# Patient Record
Sex: Female | Born: 1940 | Race: White | Hispanic: No | Marital: Married | Smoking: Never smoker
Health system: Southern US, Community
[De-identification: ages and names within clinical notes are randomized; demographics above are authoritative.]

## PROBLEM LIST (undated history)

## (undated) DIAGNOSIS — Z950 Presence of cardiac pacemaker: Secondary | ICD-10-CM

## (undated) DIAGNOSIS — I480 Paroxysmal atrial fibrillation: Secondary | ICD-10-CM

## (undated) DIAGNOSIS — I10 Essential (primary) hypertension: Secondary | ICD-10-CM

## (undated) DIAGNOSIS — M1990 Unspecified osteoarthritis, unspecified site: Secondary | ICD-10-CM

## (undated) DIAGNOSIS — R053 Chronic cough: Secondary | ICD-10-CM

## (undated) DIAGNOSIS — K219 Gastro-esophageal reflux disease without esophagitis: Secondary | ICD-10-CM

## (undated) DIAGNOSIS — K5900 Constipation, unspecified: Secondary | ICD-10-CM

## (undated) HISTORY — PX: CHOLECYSTECTOMY: SHX55

## (undated) HISTORY — PX: CATARACT EXTRACTION: SUR2

## (undated) HISTORY — PX: CARPAL TUNNEL RELEASE: SHX101

## (undated) HISTORY — PX: BREAST LUMPECTOMY: SHX2

## (undated) HISTORY — PX: FEMUR FRACTURE SURGERY: SHX633

## (undated) HISTORY — PX: RECONSTRUCTION OF EYELID: SHX6576

## (undated) HISTORY — PX: PACEMAKER IMPLANT: EP1218

## (undated) HISTORY — PX: BREAST BIOPSY: SHX20

## (undated) HISTORY — PX: ABDOMINAL HYSTERECTOMY: SHX81

---

## 2013-03-03 DIAGNOSIS — M1990 Unspecified osteoarthritis, unspecified site: Secondary | ICD-10-CM | POA: Insufficient documentation

## 2013-03-03 DIAGNOSIS — M4802 Spinal stenosis, cervical region: Secondary | ICD-10-CM | POA: Insufficient documentation

## 2013-03-31 DIAGNOSIS — K219 Gastro-esophageal reflux disease without esophagitis: Secondary | ICD-10-CM | POA: Diagnosis present

## 2013-06-08 DIAGNOSIS — E785 Hyperlipidemia, unspecified: Secondary | ICD-10-CM | POA: Diagnosis present

## 2013-09-14 DIAGNOSIS — G43909 Migraine, unspecified, not intractable, without status migrainosus: Secondary | ICD-10-CM | POA: Insufficient documentation

## 2013-09-14 DIAGNOSIS — M50222 Other cervical disc displacement at C5-C6 level: Secondary | ICD-10-CM | POA: Insufficient documentation

## 2013-09-14 DIAGNOSIS — I10 Essential (primary) hypertension: Secondary | ICD-10-CM | POA: Diagnosis present

## 2013-10-18 ENCOUNTER — Emergency Department (HOSPITAL_BASED_OUTPATIENT_CLINIC_OR_DEPARTMENT_OTHER)
Admission: EM | Admit: 2013-10-18 | Discharge: 2013-10-18 | Disposition: A | Discharge status: Home / Self Care | Payer: Medicare Other | Attending: Emergency Medicine | Admitting: Emergency Medicine

## 2013-10-18 DIAGNOSIS — Y92009 Unspecified place in unspecified non-institutional (private) residence as the place of occurrence of the external cause: Secondary | ICD-10-CM | POA: Insufficient documentation

## 2013-10-18 DIAGNOSIS — W540XXA Bitten by dog, initial encounter: Secondary | ICD-10-CM | POA: Insufficient documentation

## 2013-10-18 DIAGNOSIS — Y9301 Activity, walking, marching and hiking: Secondary | ICD-10-CM | POA: Diagnosis not present

## 2013-10-18 DIAGNOSIS — Z23 Encounter for immunization: Secondary | ICD-10-CM | POA: Diagnosis not present

## 2013-10-18 DIAGNOSIS — IMO0002 Reserved for concepts with insufficient information to code with codable children: Secondary | ICD-10-CM | POA: Diagnosis present

## 2013-10-18 MED ORDER — RABIES VACCINE, PCEC IM SUSR
1.0000 mL | Freq: Once | INTRAMUSCULAR | Status: AC
  Administered 2013-10-18: 1 mL via INTRAMUSCULAR
  Filled 2013-10-18: qty 1

## 2013-10-18 MED ORDER — RABIES IMMUNE GLOBULIN 150 UNIT/ML IM INJ
20.0000 [IU]/kg | INJECTION | Freq: Once | INTRAMUSCULAR | Status: AC
Start: 2013-10-18 — End: 2013-10-18
  Administered 2013-10-18: 1125 [IU]
  Filled 2013-10-18: qty 8

## 2013-10-18 MED ORDER — TETANUS-DIPHTH-ACELL PERTUSSIS 5-2.5-18.5 LF-MCG/0.5 IM SUSP
0.5000 mL | Freq: Once | INTRAMUSCULAR | Status: AC
  Administered 2013-10-18: 0.5 mL via INTRAMUSCULAR
  Filled 2013-10-18: qty 0.5

## 2013-10-18 NOTE — ED Provider Notes (Signed)
CSN: [id number]     Arrival date & time 10/18/13  1031 History   First MD Initiated Contact with Patient 10/18/13 1050     Chief Complaint  Patient presents with  . Insect Bite     (Consider location/radiation/quality/duration/timing/severity/associated sxs/prior Treatment) HPI Comments: Patient presents with a dog bite. She was out walking this morning and a neighbors dog ran through an electric  jumped up and either bit or scratched her right arm. She has a small wound to her right forearm. She denies any other injuries. She's not sure when her last tetanus shot was. Animal control does have the dog in quarantine currently. The dog currently has not exhibited any unusual behaviors however the dog is 5 months behind on it's rabies vaccines   No past medical history on file. No past surgical history on file. No family history on file. History  Substance Use Topics  . Smoking status: Not on file  . Smokeless tobacco: Not on file  . Alcohol Use: Not on file   OB History   No data available     Review of Systems  Constitutional: Negative for fever.  Gastrointestinal: Negative for nausea and vomiting.  Musculoskeletal: Negative for arthralgias, back pain, joint swelling and neck pain.  Skin: Positive for wound.  Neurological: Negative for weakness, numbness and headaches.      Allergies  Review of patient's allergies indicates not on file.  Home Medications   Prior to Admission medications   Not on File   BP 143/60  Pulse 65  Temp(Src) 97.9 F (36.6 C) (Oral)  Resp 18  Ht  (1.499 m)  Wt 125 lb 14.4 oz (57.108 kg)  BMI 25.42 kg/m2  SpO2 100% Physical Exam  Constitutional: She is oriented to person, place, and time. She appears well-developed and well-nourished.  HENT:  Head: Normocephalic and atraumatic.  Neck: Normal range of motion. Neck supple.  Cardiovascular: Normal rate.   Pulmonary/Chest: Effort normal.  Musculoskeletal: She exhibits no edema and no  tenderness.  Small abrasion to the mid dorsal aspect of the right forearm. There's no active bleeding. There is no underlying bony tenderness.  Neurological: She is alert and oriented to person, place, and time.  Skin: Skin is warm and dry.  Psychiatric: She has a normal mood and affect.    ED Course  Procedures (including critical care time) Labs Review Labs Reviewed - No data to display  Imaging Review No results found.   EKG Interpretation None      MDM   Final diagnoses:  Dog bite    Patient is opted to go ahead and start the rabies series. Her tetanus shot was updated. Her wound was irrigated and dressed. Rabies immunoglobulin was infiltrated into the wound and given IM. She was started on the rabies vaccines and instructed to return to an urgent care center to receive the remaining 3 vaccines.    Rolan Bucco, MD 10/18/13 1200

## 2013-10-18 NOTE — ED Notes (Signed)
States she has a bite or scratch to right arm. Pt has red area to right out arm with scratch to mid area. Pt states she was out on walk and dog ran up to her. Unsure if dog bit or scratched her. Dog was 5 months out on rabies shots.

## 2013-10-18 NOTE — Discharge Instructions (Signed)

## 2013-10-21 ENCOUNTER — Encounter: Payer: Self-pay | Admitting: Emergency Medicine

## 2013-10-21 ENCOUNTER — Emergency Department (INDEPENDENT_AMBULATORY_CARE_PROVIDER_SITE_OTHER)
Admission: EM | Admit: 2013-10-21 | Discharge: 2013-10-21 | Disposition: A | Discharge status: Home / Self Care | Payer: Medicare Other | Source: Home / Self Care

## 2013-10-21 DIAGNOSIS — Z203 Contact with and (suspected) exposure to rabies: Secondary | ICD-10-CM

## 2013-10-21 DIAGNOSIS — Z23 Encounter for immunization: Secondary | ICD-10-CM

## 2013-10-21 DIAGNOSIS — S51809A Unspecified open wound of unspecified forearm, initial encounter: Secondary | ICD-10-CM

## 2013-10-21 HISTORY — DX: Essential (primary) hypertension: I10

## 2013-10-21 MED ORDER — RABIES VACCINE, PCEC IM SUSR
1.0000 mL | Freq: Once | INTRAMUSCULAR | Status: AC
  Administered 2013-10-21: 1 mL via INTRAMUSCULAR

## 2013-10-21 NOTE — ED Notes (Signed)
Here for 2nd shot in Rabies series

## 2013-10-21 NOTE — ED Notes (Signed)
Elizabeth Barnett is here for 2nd Rabavert vaccine, initial given @ [facility] in addition to immunoglobulin. She reports here that she received the initial vaccine without adverse reaction Vaccine administered @ 945. I filled in list of medications and allergies after she left and received an alert/contraindication for Voltaren allergy and the Rabavert vaccine. Her allergies were not documented in Epic from previous ER visit. I called patient and left message to call back to discuss allergy. She showed up @ [facility] about 30 minutes later (11:10am) and reports anaphylaxis with Voltaren. She denies any SOB or throat tightness/swelling and denies any adverse reaction from initial Rabavert vaccine given @ [facility].Vitals BP141/83, HR 76, temp 98.43F and oxygen 97%. She reports "I feel fine".

## 2013-10-25 ENCOUNTER — Encounter: Payer: Self-pay | Admitting: Emergency Medicine

## 2013-10-25 ENCOUNTER — Emergency Department (INDEPENDENT_AMBULATORY_CARE_PROVIDER_SITE_OTHER)
Admission: EM | Admit: 2013-10-25 | Discharge: 2013-10-25 | Disposition: A | Discharge status: Home / Self Care | Payer: Medicare Other | Source: Home / Self Care

## 2013-10-25 DIAGNOSIS — Z23 Encounter for immunization: Secondary | ICD-10-CM

## 2013-10-25 DIAGNOSIS — S51809A Unspecified open wound of unspecified forearm, initial encounter: Secondary | ICD-10-CM

## 2013-10-25 MED ORDER — RABIES VACCINE, PCEC IM SUSR
1.0000 mL | Freq: Once | INTRAMUSCULAR | Status: AC
  Administered 2013-10-25: 1 mL via INTRAMUSCULAR

## 2013-10-25 NOTE — ED Notes (Signed)
Here to receive Third Rabies Injection to right arm.

## 2015-05-09 DIAGNOSIS — E559 Vitamin D deficiency, unspecified: Secondary | ICD-10-CM | POA: Insufficient documentation

## 2016-02-28 DIAGNOSIS — H532 Diplopia: Secondary | ICD-10-CM | POA: Insufficient documentation

## 2017-08-04 DIAGNOSIS — G5622 Lesion of ulnar nerve, left upper limb: Secondary | ICD-10-CM | POA: Insufficient documentation

## 2017-08-04 DIAGNOSIS — G44209 Tension-type headache, unspecified, not intractable: Secondary | ICD-10-CM | POA: Insufficient documentation

## 2017-09-11 ENCOUNTER — Encounter (HOSPITAL_COMMUNITY)
Admission: RE | Disposition: A | Discharge status: Home / Self Care | Payer: Self-pay | Source: Ambulatory Visit | Attending: Gastroenterology

## 2017-09-11 ENCOUNTER — Ambulatory Visit (HOSPITAL_COMMUNITY)
Admission: RE | Admit: 2017-09-11 | Discharge: 2017-09-11 | Disposition: A | Discharge status: Home / Self Care | Payer: Medicare Other | Source: Ambulatory Visit | Attending: Gastroenterology | Admitting: Gastroenterology

## 2017-09-11 DIAGNOSIS — K6289 Other specified diseases of anus and rectum: Secondary | ICD-10-CM | POA: Insufficient documentation

## 2017-09-11 DIAGNOSIS — K5900 Constipation, unspecified: Secondary | ICD-10-CM | POA: Insufficient documentation

## 2017-09-11 HISTORY — PX: ANAL RECTAL MANOMETRY: SHX6358

## 2017-09-11 SURGERY — MANOMETRY, ANORECTAL

## 2017-09-11 NOTE — Progress Notes (Signed)
Ano rectal manometry performed per protocol.  Patient tolerated procedure without complications.  Patient unable to expel balloon within the three minute time frame during the balloon expulsion test.  Results will be sent to Dr. Maisie Fushomas to interpret.

## 2017-09-14 ENCOUNTER — Encounter (HOSPITAL_COMMUNITY): Payer: Self-pay | Admitting: Gastroenterology

## 2018-03-31 DIAGNOSIS — I4510 Unspecified right bundle-branch block: Secondary | ICD-10-CM | POA: Insufficient documentation

## 2018-08-30 DIAGNOSIS — I495 Sick sinus syndrome: Secondary | ICD-10-CM | POA: Insufficient documentation

## 2018-09-03 DIAGNOSIS — Z950 Presence of cardiac pacemaker: Secondary | ICD-10-CM | POA: Insufficient documentation

## 2018-09-28 DIAGNOSIS — R7303 Prediabetes: Secondary | ICD-10-CM | POA: Insufficient documentation

## 2018-11-12 DIAGNOSIS — I480 Paroxysmal atrial fibrillation: Secondary | ICD-10-CM | POA: Insufficient documentation

## 2019-04-14 DIAGNOSIS — K649 Unspecified hemorrhoids: Secondary | ICD-10-CM | POA: Insufficient documentation

## 2019-04-14 DIAGNOSIS — Z8719 Personal history of other diseases of the digestive system: Secondary | ICD-10-CM | POA: Insufficient documentation

## 2019-08-01 DIAGNOSIS — M6289 Other specified disorders of muscle: Secondary | ICD-10-CM | POA: Insufficient documentation

## 2019-12-06 ENCOUNTER — Other Ambulatory Visit: Payer: Self-pay | Admitting: Gastroenterology

## 2019-12-06 DIAGNOSIS — R110 Nausea: Secondary | ICD-10-CM

## 2019-12-06 DIAGNOSIS — K219 Gastro-esophageal reflux disease without esophagitis: Secondary | ICD-10-CM

## 2019-12-14 ENCOUNTER — Ambulatory Visit
Admission: RE | Admit: 2019-12-14 | Discharge: 2019-12-14 | Disposition: A | Discharge status: Home / Self Care | Payer: Medicare Other | Source: Ambulatory Visit | Attending: Gastroenterology | Admitting: Gastroenterology

## 2019-12-14 DIAGNOSIS — K219 Gastro-esophageal reflux disease without esophagitis: Secondary | ICD-10-CM

## 2019-12-14 DIAGNOSIS — R110 Nausea: Secondary | ICD-10-CM

## 2019-12-14 IMAGING — RF DG UGI W/ HIGH DENSITY W/O KUB
10 series · 14 of 24 positions shown · non-contrast
Comparison: CT abdomen [DATE]

CLINICAL DATA: Abdominal pain.  GE reflux.

EXAM:
UPPER GI SERIES WITH KUB
TECHNIQUE: After obtaining a scout radiograph a routine upper GI series was
performed using thin and high density barium.
FLUOROSCOPY TIME:  Fluoroscopy Time:  2 minutes and 36 seconds
Radiation Exposure Index (if provided by the fluoroscopic device):
160 mGy
Number of Acquired Spot Images: 3

[Series 1: one shot · 0.14mm/px · 1 of 1 slices shown (1 of 4)]
[im 1/1]
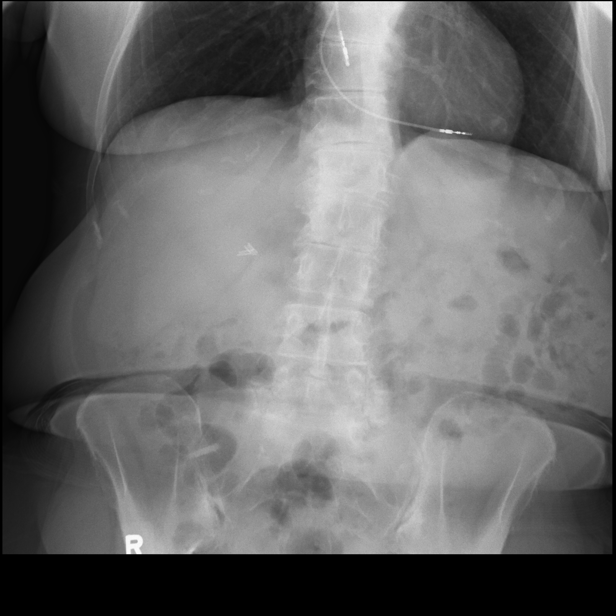

[Series 2: sequence · 1 of 9 frames shown (1 of 6)]
[frame 5/9]
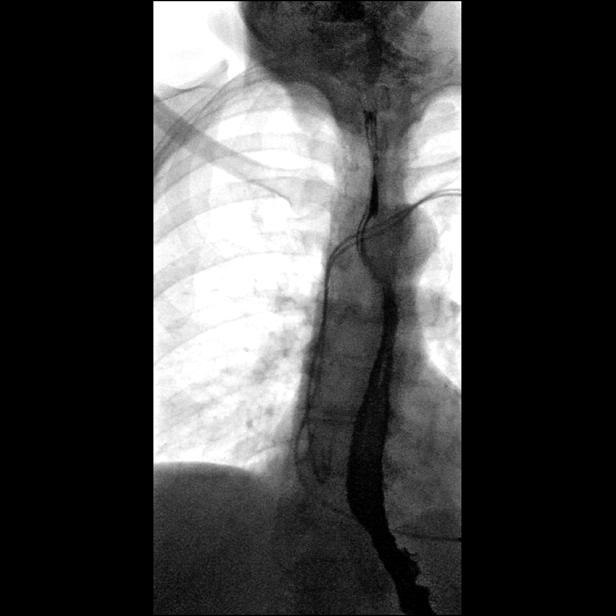

[Series 3: one shot · 0.15mm/px · 1 of 2 slices shown (2 of 4)]
[im 2/2]
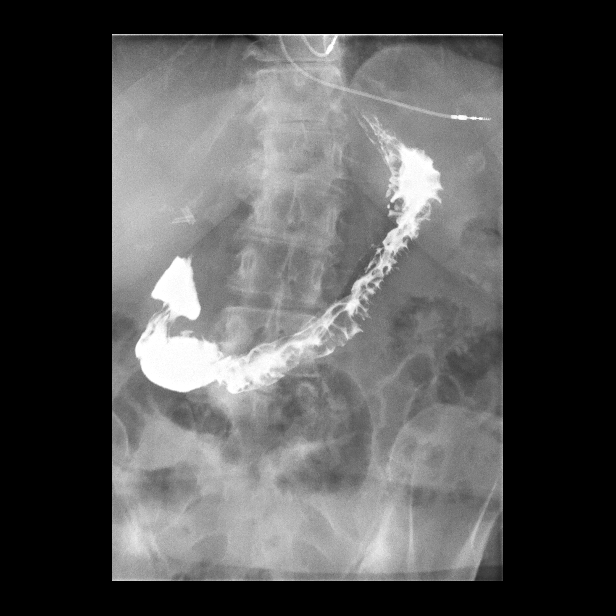

[Series 4: sequence · 2 of 12 frames shown (2 of 6)]
[frame 7/12]
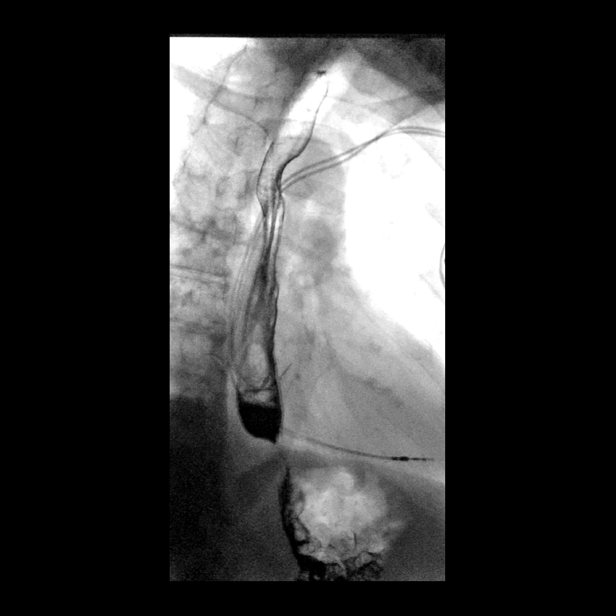
[frame 11/12]
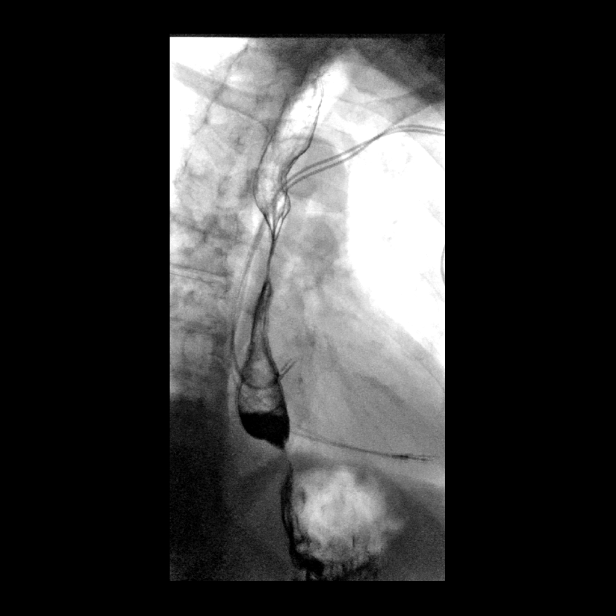

[Series 5: sequence · 1 of 14 frames shown (3 of 6)]
[frame 8/14]
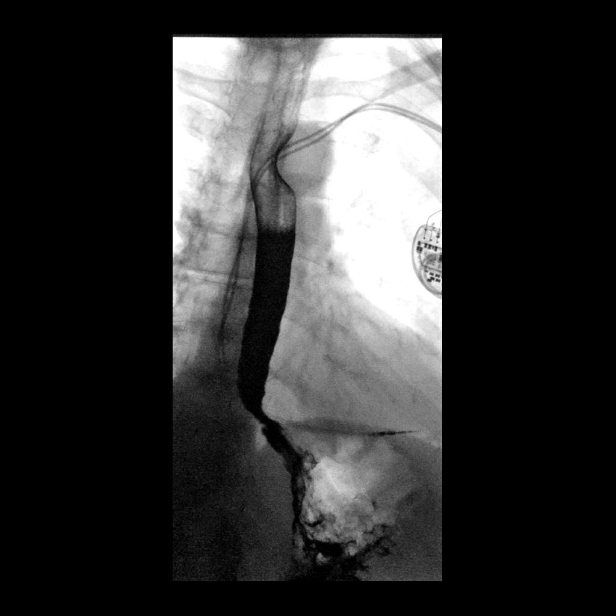

[Series 6: one shot · 0.15mm/px · 2 of 5 slices shown (3 of 4)]
[im 2/5]
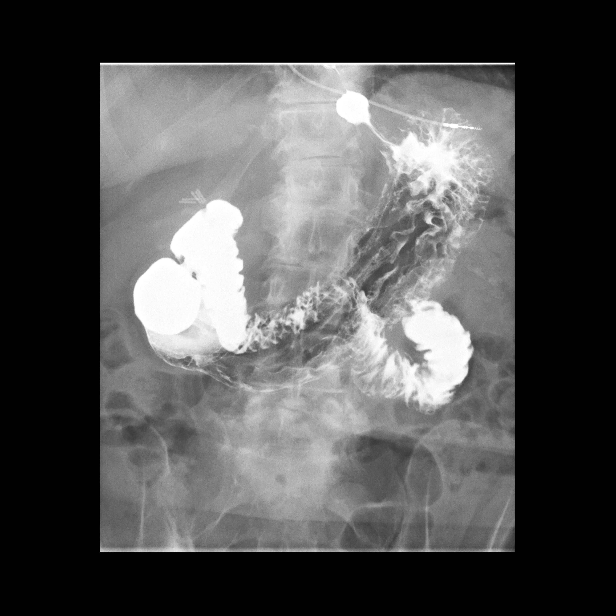
[im 3/5]
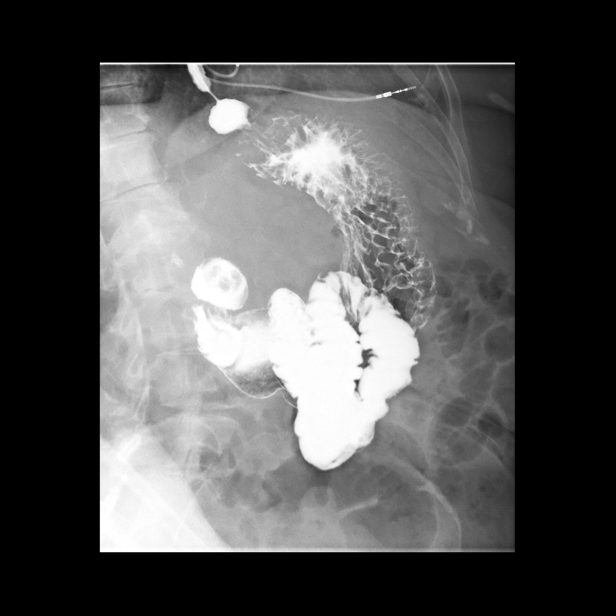

[Series 7: sequence · 2 of 17 frames shown (4 of 6)]
[frame 3/17]
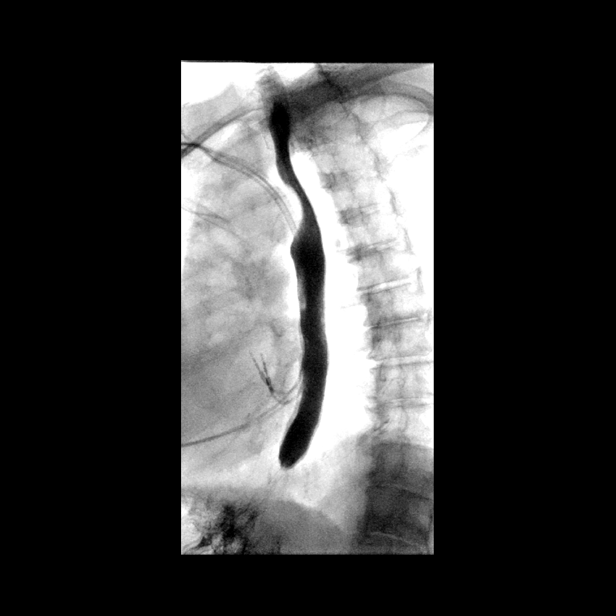
[frame 15/17]
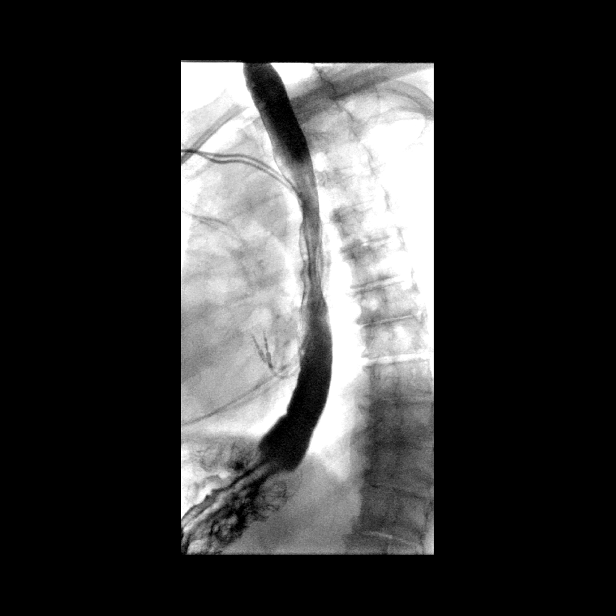

[Series 8: sequence · 2 of 21 frames shown (5 of 6)]
[frame 14/21]
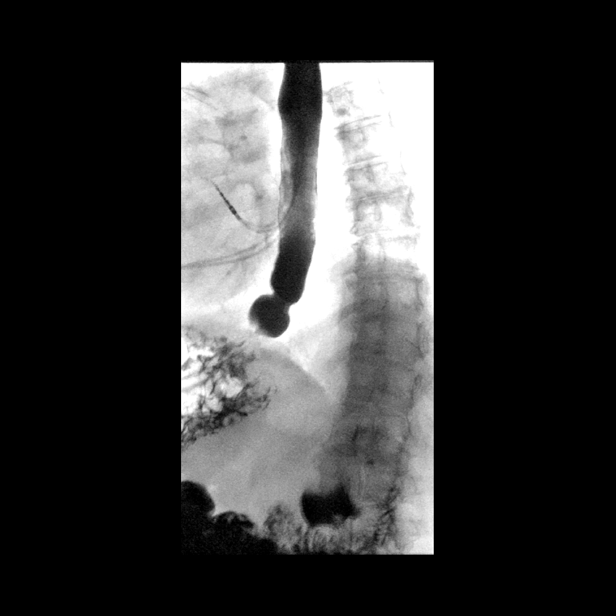
[frame 18/21]
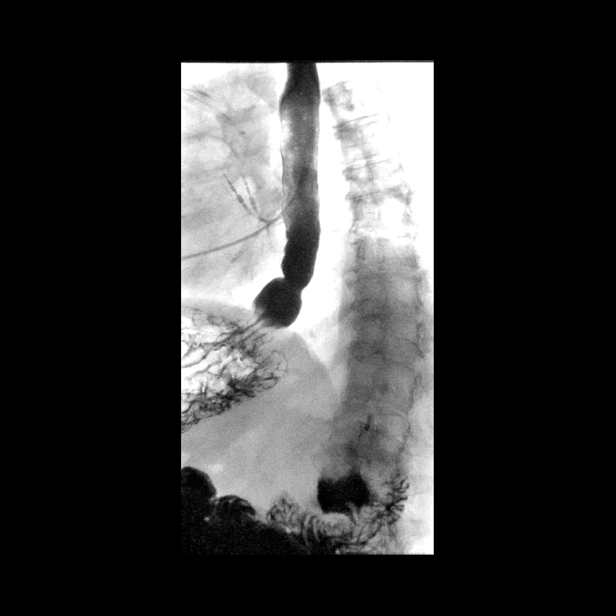

[Series 9: sequence · 1 of 7 frames shown (6 of 6)]
[frame 4/7]
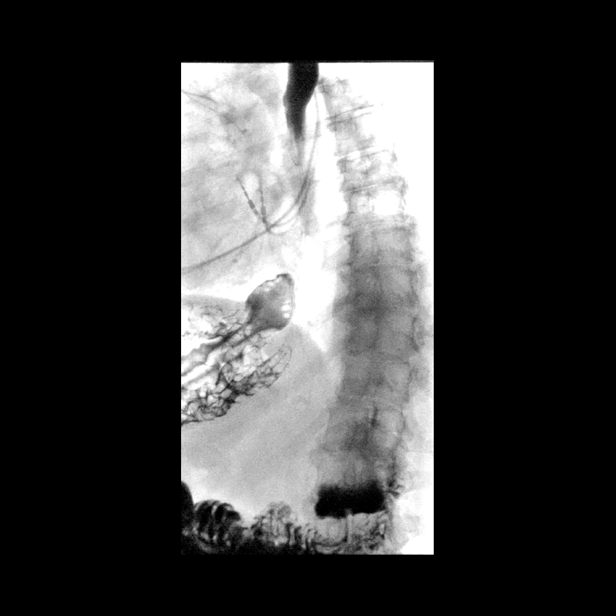

[Series 10: one shot · 1 of 2 slices shown (4 of 4)]
[im 2/2]
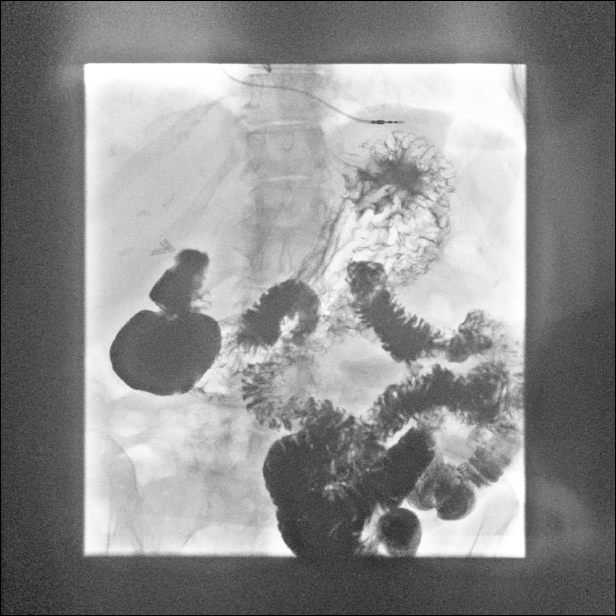

[14 of 24 positions shown; findings below may reference images not displayed]

FINDINGS: Nonspecific esophageal motility disorder with occasional disruption
of the primary peristaltic wave and occasional tertiary
contractions. There is a small sliding-type hiatal hernia and a
lower esophageal mucosal B ring. The 13 mm barium pill did pass
through this area.

Normal mucosal folds in the stomach. No evidence of inflammation or
ulcer. The duodenal bulb and C-loop are unremarkable. The duodenal
jejunal junction is in its normal anatomic location.

No demonstrable GE reflux.
IMPRESSION: 1. Nonspecific esophageal motility disorder.
2. Small sliding-type hiatal hernia and lower esophageal mucosal B
ring. The 13 mm barium pill did pass through this area.
3. No demonstrable GE reflux.
4. Normal appearance of the stomach and duodenum.

## 2020-01-31 DIAGNOSIS — D84821 Immunodeficiency due to drugs: Secondary | ICD-10-CM | POA: Insufficient documentation

## 2020-02-27 ENCOUNTER — Telehealth: Payer: Self-pay

## 2020-02-27 ENCOUNTER — Other Ambulatory Visit: Payer: Self-pay | Admitting: Physician Assistant

## 2020-02-27 ENCOUNTER — Other Ambulatory Visit: Payer: Self-pay

## 2020-02-27 DIAGNOSIS — R1084 Generalized abdominal pain: Secondary | ICD-10-CM

## 2020-02-27 MED ORDER — PREDNISONE 50 MG PO TABS: ORAL_TABLET | ORAL | 0 refills | Status: DC

## 2020-02-27 NOTE — Telephone Encounter (Signed)
13 hr prep for contrast allergy

## 2020-03-01 ENCOUNTER — Ambulatory Visit
Admission: RE | Admit: 2020-03-01 | Discharge: 2020-03-01 | Disposition: A | Discharge status: Home / Self Care | Payer: Medicare Other | Source: Ambulatory Visit | Attending: Physician Assistant | Admitting: Physician Assistant

## 2020-03-01 DIAGNOSIS — R1084 Generalized abdominal pain: Secondary | ICD-10-CM

## 2020-03-01 IMAGING — CT CT ABD-PELV W/ CM
2 of 5 series · 12 of 46 positions shown, 14 images · IV contrast (iopamidol)
Comparison: Noncontrast CT on [DATE]

CLINICAL DATA: Right lower quadrant pain for 3 months. Possible
hernia.

EXAM:
CT ABDOMEN AND PELVIS WITH CONTRAST
TECHNIQUE: Multidetector CT imaging of the abdomen and pelvis was performed
using the standard protocol following bolus administration of
intravenous contrast.
CONTRAST:  100mL [CU] IOPAMIDOL ([CU]) INJECTION 61%

[Series 2: abd pelvis 5.00 br40 s3 axial · axial · 0.53mm/px · z∈[+1275,+1580]mm · 9 of 73 slices shown, 11 images]
[im 8/73  soft-tissue]
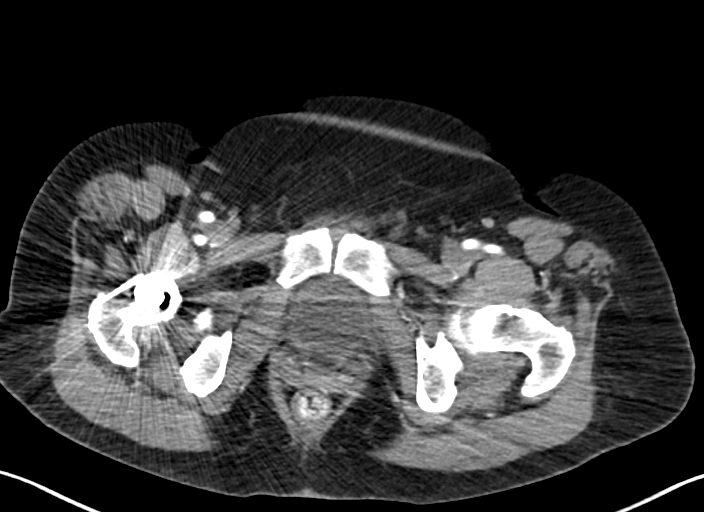
[im 8/73  bone]
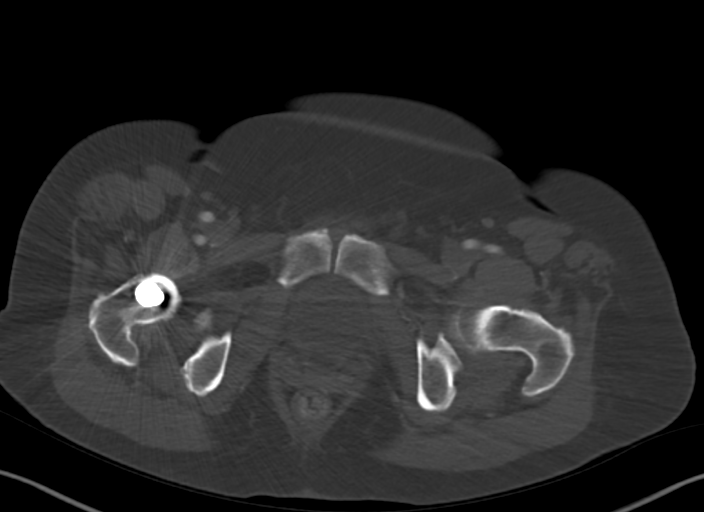
[im 16/73  soft-tissue]
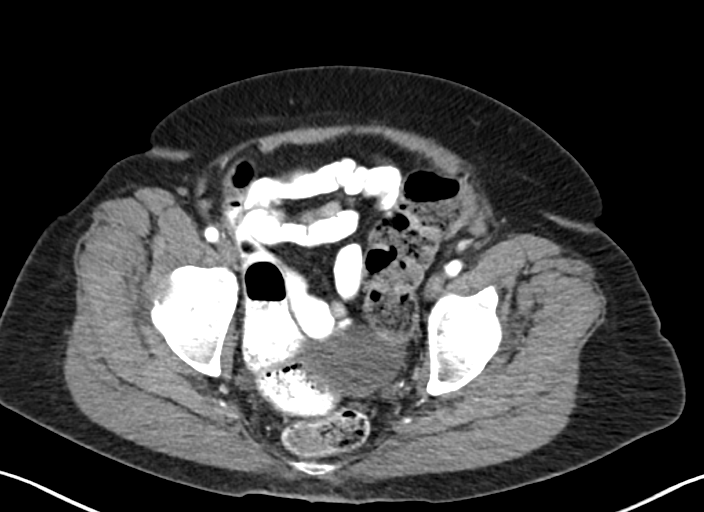
[im 23/73  soft-tissue]
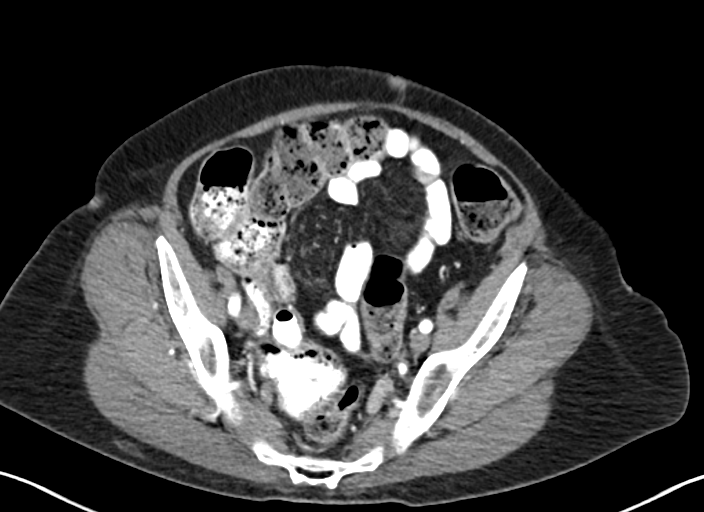
[im 31/73  soft-tissue]
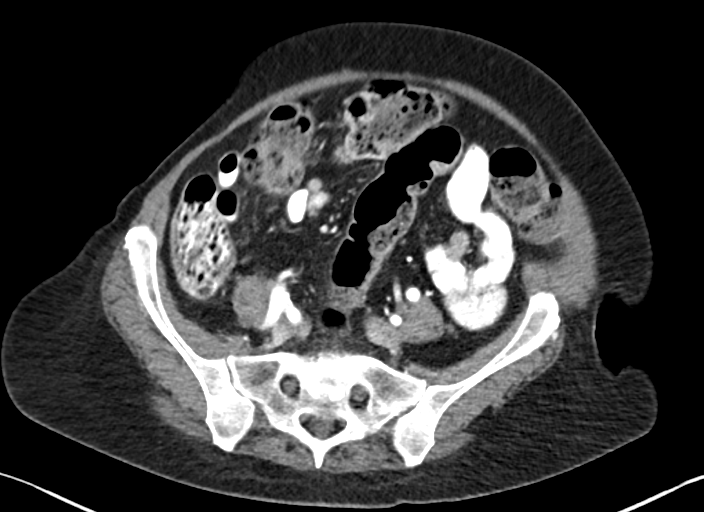
[im 38/73  soft-tissue]
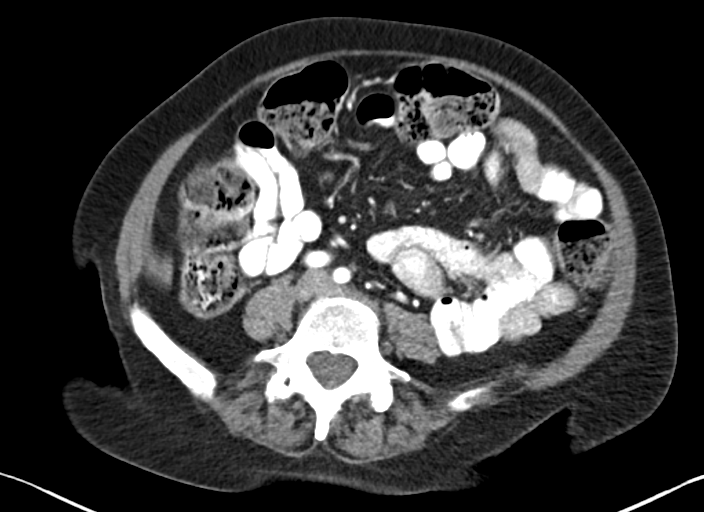
[im 46/73  soft-tissue]
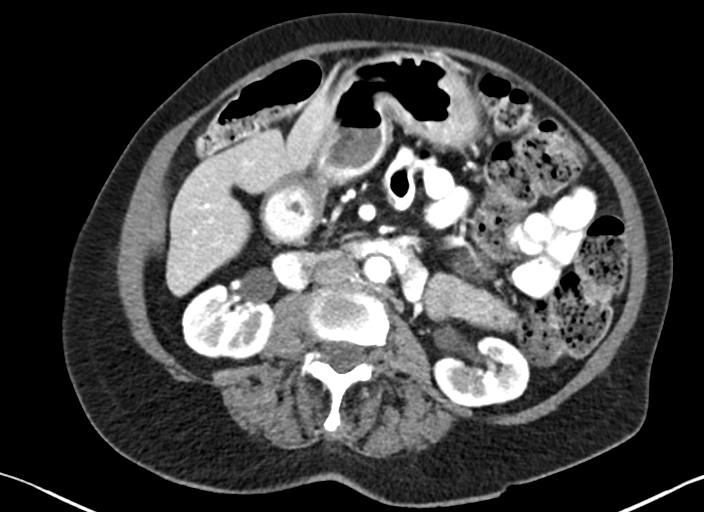
[im 54/73  soft-tissue]
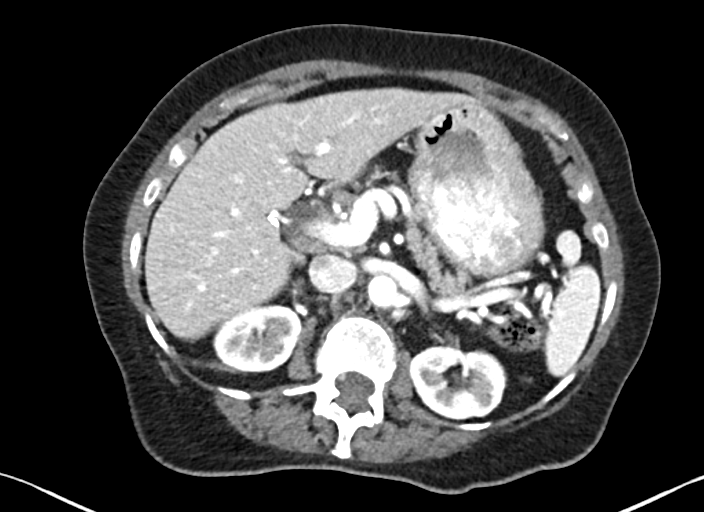
[im 61/73  soft-tissue]
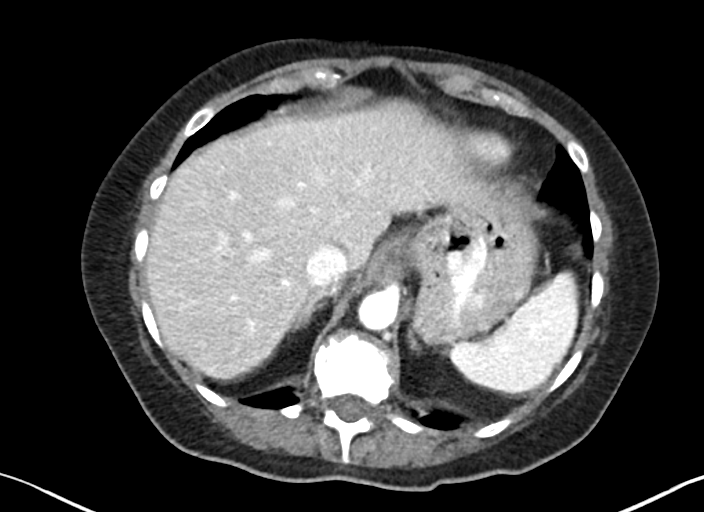
[im 69/73  soft-tissue]
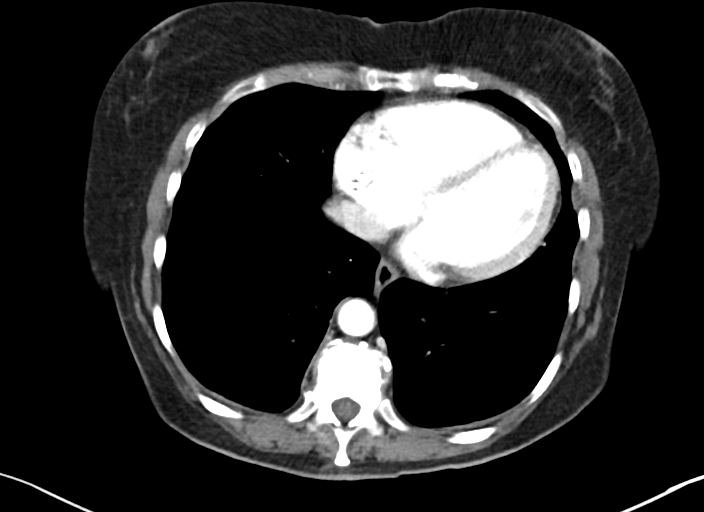
[im 69/73  bone]
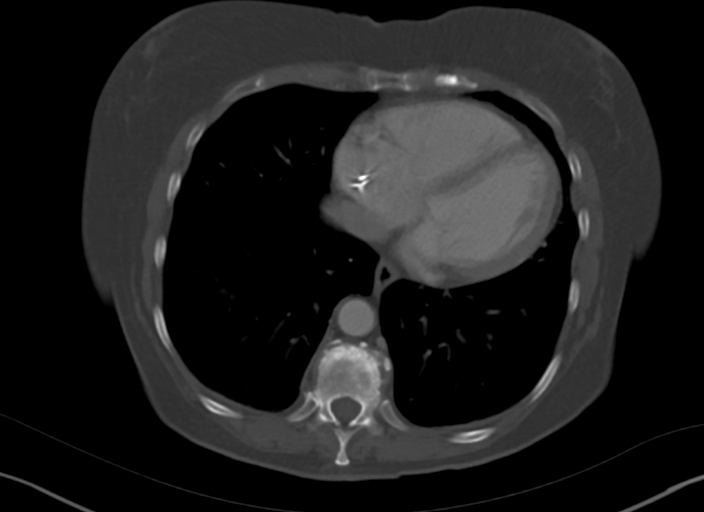

[Series 6: abd pelvis 2.00 br40 s3 cor · coronal · 0.72mm/px · 3 of 135 slices shown]
[im 45/135  soft-tissue]
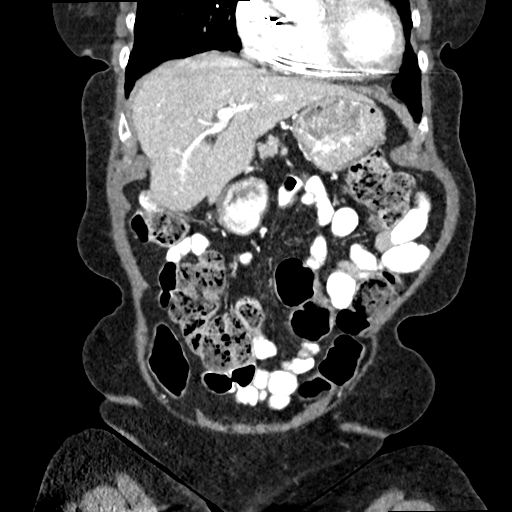
[im 60/135  soft-tissue]
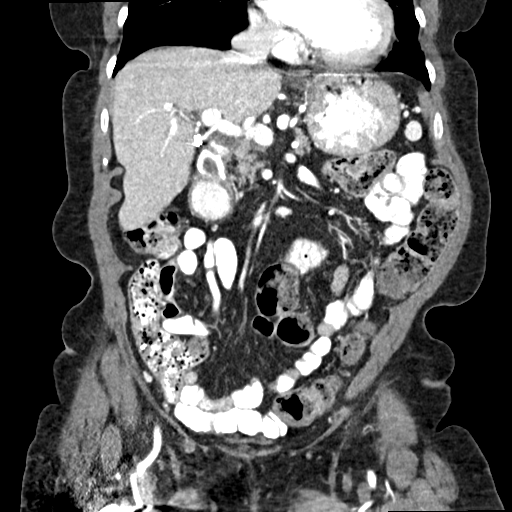
[im 75/135  soft-tissue]
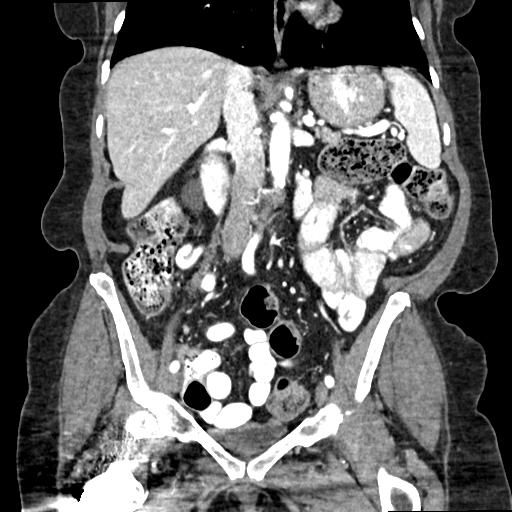

[12 of 46 positions shown; findings below may reference images not displayed]

FINDINGS: Lower Chest: No acute findings.

Hepatobiliary: No hepatic masses identified. Prior cholecystectomy.
No evidence of biliary obstruction.

Pancreas:  No mass or inflammatory changes.

Spleen: Within normal limits in size. Tiny cyst noted in inferior
aspect of spleen.

Adrenals/Urinary Tract: No masses identified. No evidence of
ureteral calculi or hydronephrosis.

Stomach/Bowel: No evidence of obstruction, inflammatory process or
abnormal fluid collections. Although the appendix is not directly
visualized, no inflammatory process seen in region of the cecum or
elsewhere. Large amount of stool seen throughout the colon which is
increased since previous study.

Vascular/Lymphatic: No pathologically enlarged lymph nodes. No
abdominal aortic aneurysm. Aortic atherosclerotic calcification
noted.

Reproductive: Prior hysterectomy noted. Adnexal regions are
unremarkable in appearance.

Other:  None.  No evidence of ventral abdominal wall hernia or mass.

Musculoskeletal: No suspicious bone lesions identified. Internal
fixation hardware again seen in the right hip.
IMPRESSION: No evidence of appendicitis or other acute findings.

Large stool burden noted; recommend clinical correlation for
possible constipation.

Aortic Atherosclerosis ([CU]-[CU]).

## 2020-03-01 MED ORDER — IOPAMIDOL (ISOVUE-300) INJECTION 61%
100.0000 mL | Freq: Once | INTRAVENOUS | Status: AC | PRN
  Administered 2020-03-01: 100 mL via INTRAVENOUS

## 2020-03-08 DIAGNOSIS — F411 Generalized anxiety disorder: Secondary | ICD-10-CM | POA: Insufficient documentation

## 2020-08-30 ENCOUNTER — Other Ambulatory Visit (HOSPITAL_COMMUNITY): Payer: Self-pay | Admitting: Orthopaedic Surgery

## 2020-08-30 DIAGNOSIS — M545 Low back pain, unspecified: Secondary | ICD-10-CM

## 2020-09-27 ENCOUNTER — Other Ambulatory Visit: Payer: Self-pay

## 2020-09-27 ENCOUNTER — Ambulatory Visit (HOSPITAL_COMMUNITY)
Admission: RE | Admit: 2020-09-27 | Discharge: 2020-09-27 | Disposition: A | Discharge status: Home / Self Care | Payer: Medicare Other | Source: Ambulatory Visit | Attending: Orthopaedic Surgery | Admitting: Orthopaedic Surgery

## 2020-09-27 ENCOUNTER — Ambulatory Visit (HOSPITAL_COMMUNITY)
Admission: RE | Admit: 2020-09-27 | Discharge: 2020-09-27 | Disposition: A | Discharge status: Home / Self Care | Payer: Medicare Other | Source: Ambulatory Visit | Attending: Physician Assistant | Admitting: Physician Assistant

## 2020-09-27 DIAGNOSIS — M545 Low back pain, unspecified: Secondary | ICD-10-CM | POA: Diagnosis present

## 2020-09-27 DIAGNOSIS — Z950 Presence of cardiac pacemaker: Secondary | ICD-10-CM | POA: Insufficient documentation

## 2020-09-27 IMAGING — MR MR LUMBAR SPINE W/O CM
4 of 5 series · 28 of 48 positions shown · non-contrast
Comparison: None.

CLINICAL DATA: Low back pain, unspecified back pain laterality,
unspecified chronicity, unspecified whether sciatica present [BR]
([BR]-CM)

EXAM:
MRI LUMBAR SPINE WITHOUT CONTRAST
TECHNIQUE: Multiplanar, multisequence MR imaging of the lumbar spine was
performed. No intravenous contrast was administered.

[Series 5: T2 · sagittal · 4.0mm · 0.73mm/px · 8 of 16 slices shown (1 of 2)]
[im 1/16]
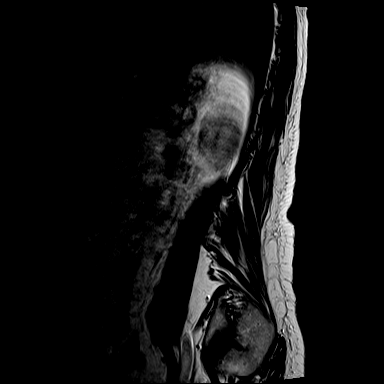
[im 3/16]
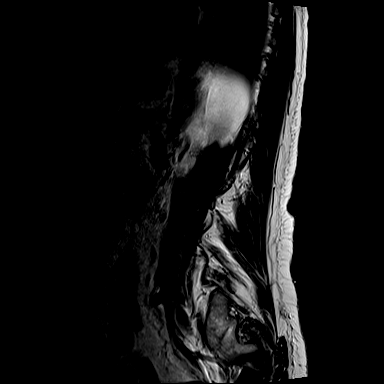
[im 5/16]
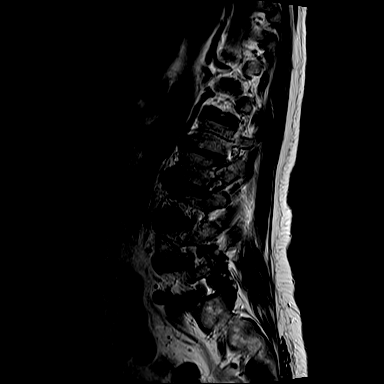
[im 7/16]
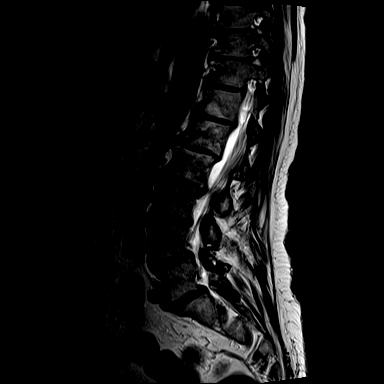
[im 9/16]
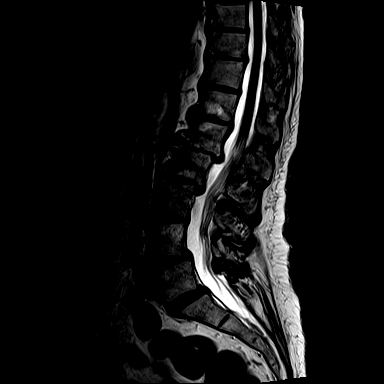
[im 11/16]
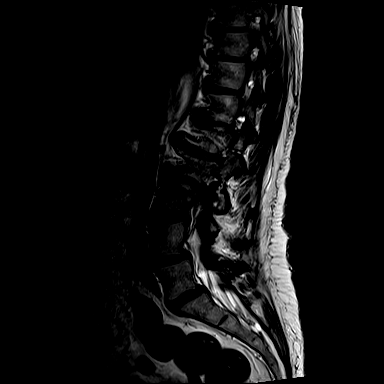
[im 13/16]
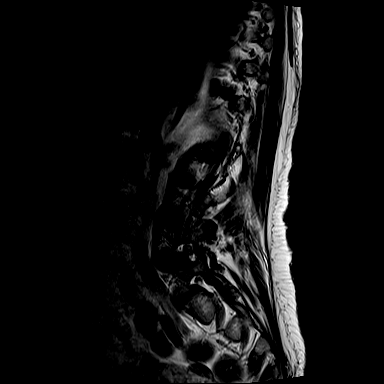
[im 16/16]
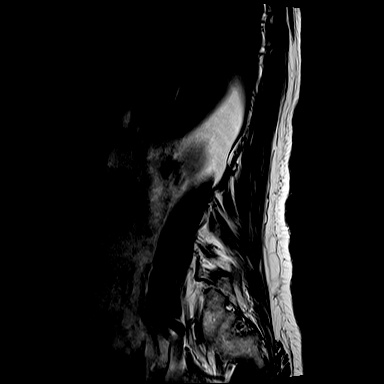

[Series 7: T1 · sagittal · 4.0mm · 0.88mm/px · 7 of 16 slices shown (1 of 2)]
[im 1/16]
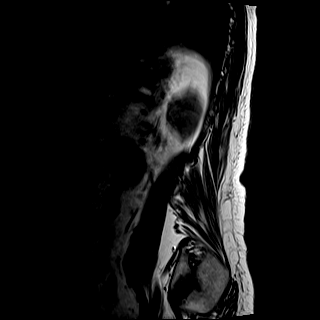
[im 3/16]
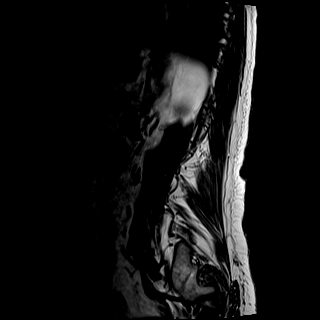
[im 6/16]
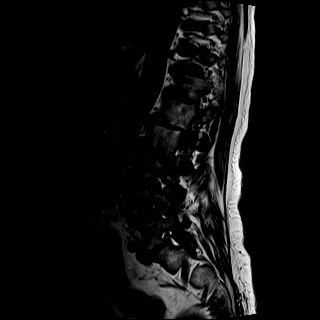
[im 8/16]
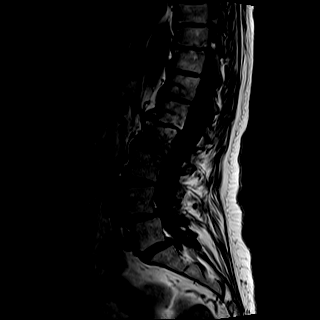
[im 11/16]
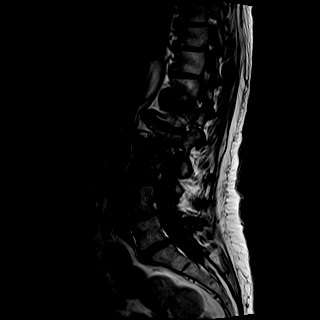
[im 13/16]
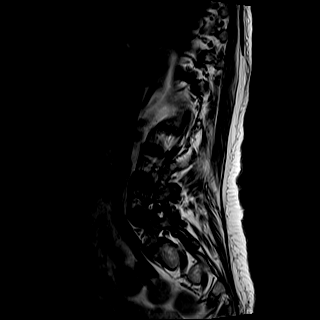
[im 16/16]
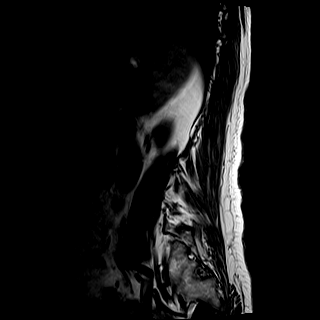

[Series 8: T2 · axial · 4.0mm · 0.57mm/px · z∈[-50,+148]mm · 9 of 30 slices shown (2 of 2)]
[im 1/30]
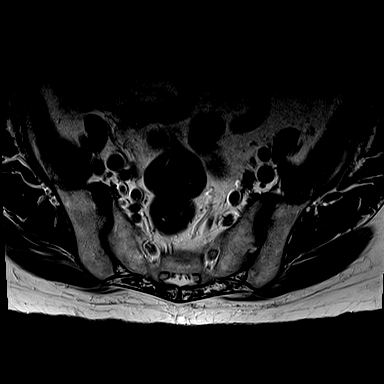
[im 5/30]
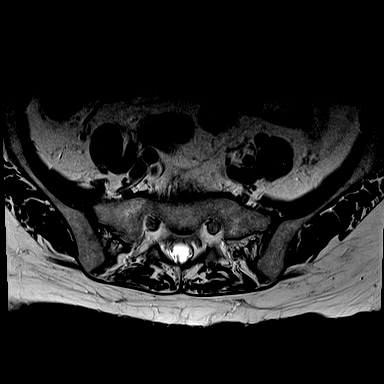
[im 10/30]
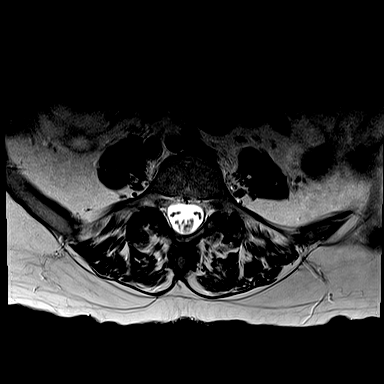
[im 13/30]
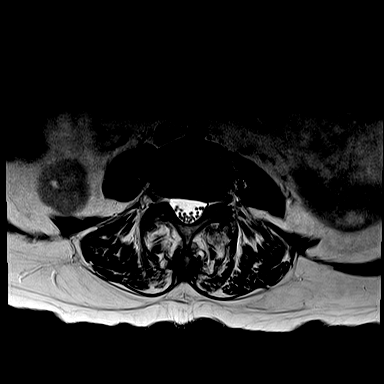
[im 15/30]
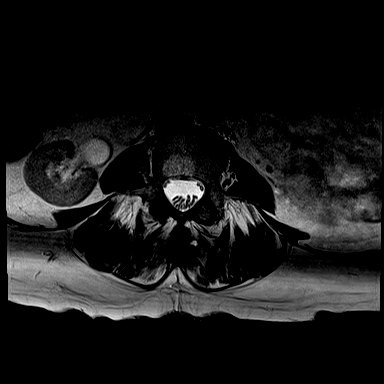
[im 17/30]
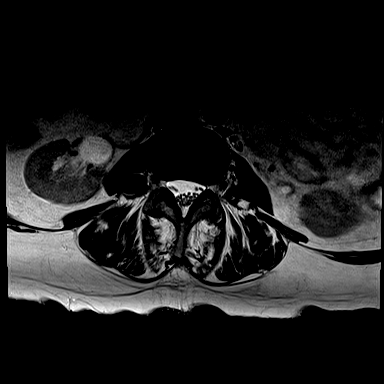
[im 20/30]
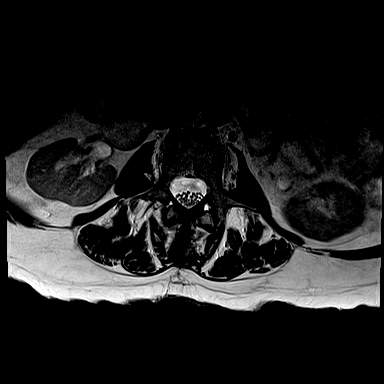
[im 25/30]
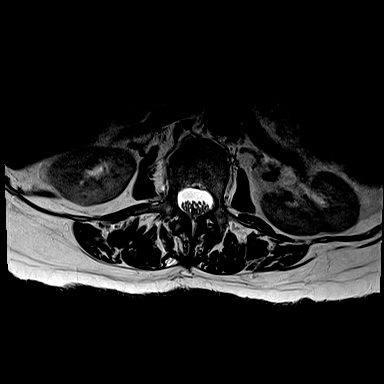
[im 30/30]
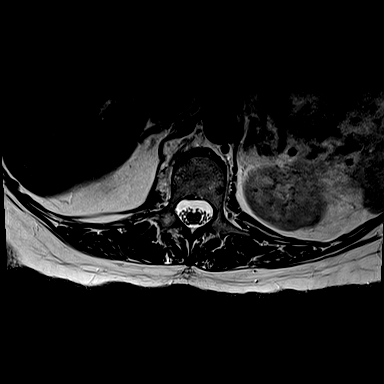

[Series 9: T1 · axial · 4.0mm · 0.34mm/px · z∈[-50,+124]mm · 4 of 30 slices shown (2 of 2)]
[im 1/30]
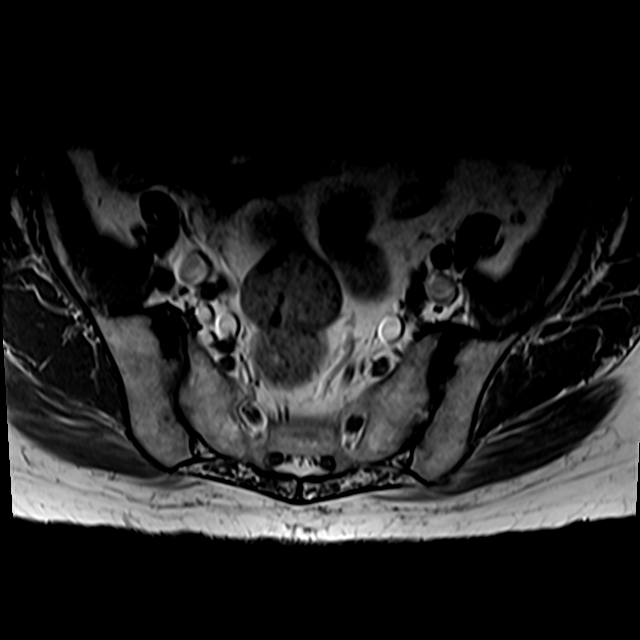
[im 5/30]
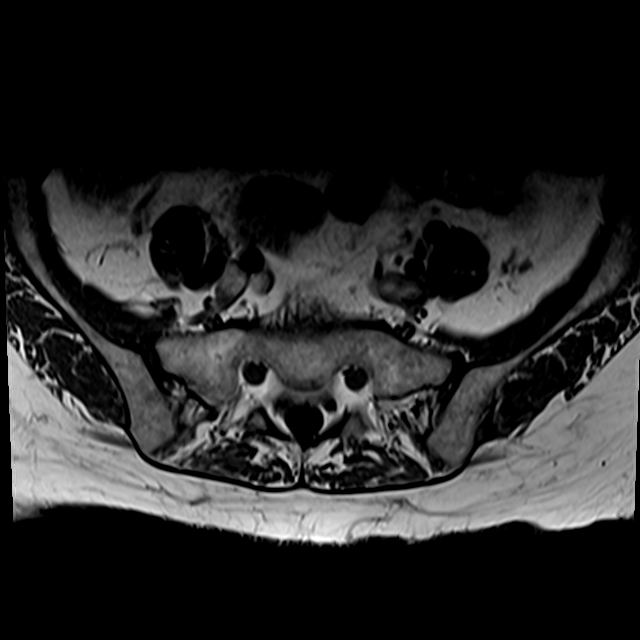
[im 15/30]
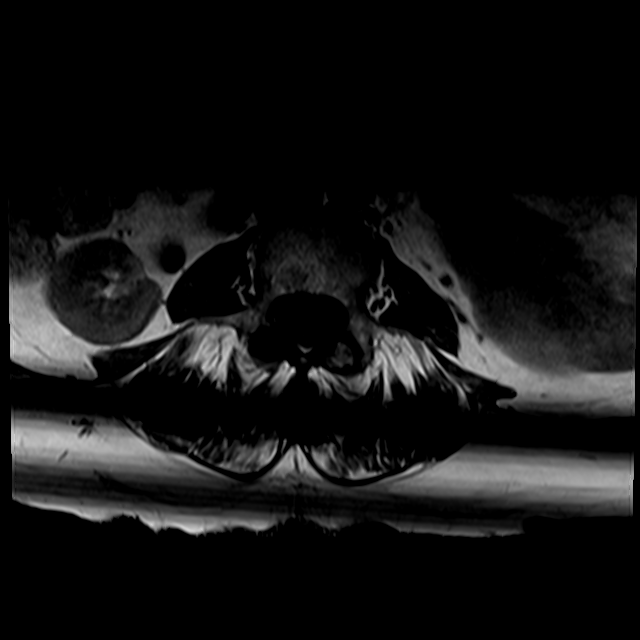
[im 25/30]
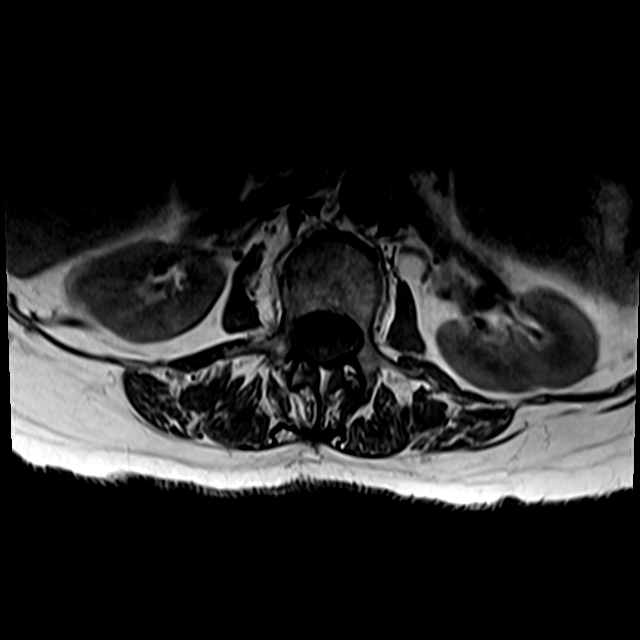

[28 of 48 positions shown; findings below may reference images not displayed]

FINDINGS: Segmentation: Standard segmentation is seen. The inferior-most fully
formed intervertebral disc is labeled L5-S1.

Alignment: Slight stepwise retrolisthesis of L1 on L2 and L2 on L3.
Stepwise grade 1 anterolisthesis of L4 on L5 and L5 on S1.

Vertebrae: Degenerative/discogenic endplate signal changes at
T12-L1, L1-L2, L2-L3. Benign vertebral venous malformation at L4. No
specific evidence of discitis/osteomyelitis, acute fracture, or
suspicious bone lesion.

Conus medullaris and cauda equina: Conus extends to the L2 level.
Conus appears normal.

Paraspinal and other soft tissues: Unremarkable.

Disc levels:

T12-L1: Only imaged sagittally. Mild disc bulging without
significant canal or foraminal stenosis.

L1-L2: Mild retrolisthesis. Right subarticular and foraminal disc
protrusion. Resulting moderate right foraminal stenosis. Mild right
subarticular recess stenosis without significant canal stenosis.

L2-L3: Mild retrolisthesis. Bilateral subarticular/foraminal disc
protrusions. Mild bilateral facet arthropathy. Resulting moderate
bilateral foraminal stenosis. Mild canal and bilateral subarticular
recess stenosis.

L3-L4: Mild broad disc bulge and mild facet arthropathy without
significant canal or foraminal stenosis.

L4-L5: Grade 1 anterolisthesis of L4 on L5. Uncovering of the disc
with superimposed mild broad disc bulge. Moderate bilateral facet
arthropathy. Resulting moderate left and mild right foraminal
stenosis without significant canal stenosis.

L5-S1: Grade 1 anterolisthesis with uncovering of the disc and
superimposed mild broad disc bulge. Moderate bilateral facet
arthropathy. Resulting mild bilateral foraminal stenosis. No
significant canal stenosis.
IMPRESSION: 1. Moderate foraminal stenosis on the right at L1-L2, bilaterally at
L2-L3, and on the left at L4-L5. Mild foraminal stenosis on the
right at L4-L5 and bilaterally at L5-S1.
2. Mild canal stenosis at L2-L3

## 2020-09-27 IMAGING — CR DG CHEST 1V
1 series · 1 of 1 positions shown · non-contrast
Comparison: [DATE]

CLINICAL DATA: History of hypertension

EXAM:
CHEST  1 VIEW

[chest pa]
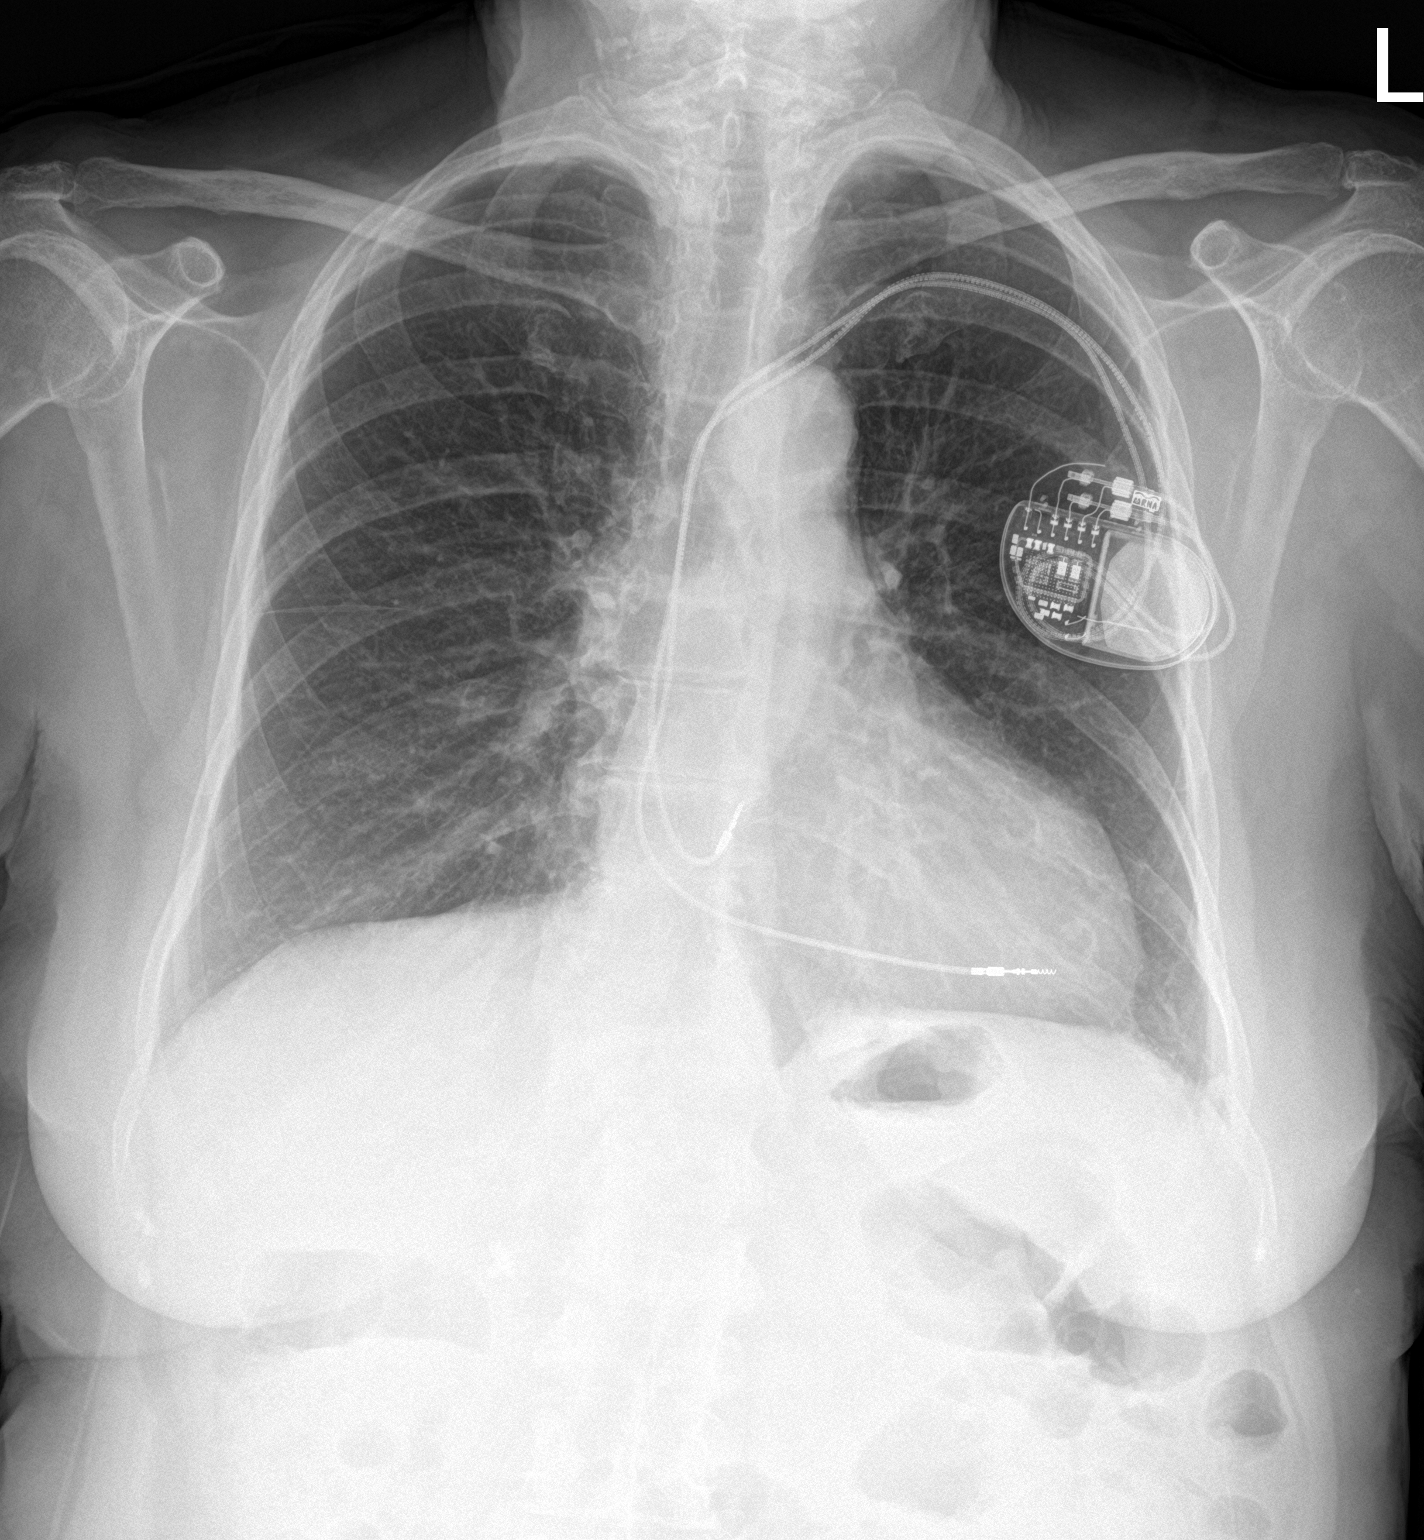

[1 of 1 positions shown; findings below may reference images not displayed]

FINDINGS: Stable positioning of a left-sided implanted cardiac device. Normal
heart size. No focal airspace consolidation, pleural effusion, or
pneumothorax.
IMPRESSION: No active disease.

## 2020-09-27 NOTE — Progress Notes (Signed)
Patient here today at [facility] for [facility] lumbar spine wo contrast. Patient has a medtronic device and had chest xray upon arrival. [facility] sent to [facility] and [facility]. Orders received for DOO 90

## 2020-12-12 ENCOUNTER — Emergency Department (HOSPITAL_COMMUNITY)
Admission: EM | Admit: 2020-12-12 | Discharge: 2020-12-12 | Discharge status: Against Medical Advice | Payer: Medicare Other

## 2020-12-12 NOTE — ED Notes (Signed)
Patient left without being seen due to wait times  °

## 2021-03-16 ENCOUNTER — Emergency Department (HOSPITAL_COMMUNITY)
Admission: EM | Admit: 2021-03-16 | Discharge: 2021-03-16 | Disposition: A | Discharge status: Home / Self Care | Payer: Medicare Other | Attending: Emergency Medicine | Admitting: Emergency Medicine

## 2021-03-16 ENCOUNTER — Other Ambulatory Visit: Payer: Self-pay

## 2021-03-16 ENCOUNTER — Emergency Department (HOSPITAL_COMMUNITY): Payer: Medicare Other

## 2021-03-16 ENCOUNTER — Encounter (HOSPITAL_COMMUNITY): Payer: Self-pay | Admitting: Emergency Medicine

## 2021-03-16 DIAGNOSIS — S0990XA Unspecified injury of head, initial encounter: Secondary | ICD-10-CM

## 2021-03-16 DIAGNOSIS — W01198A Fall on same level from slipping, tripping and stumbling with subsequent striking against other object, initial encounter: Secondary | ICD-10-CM | POA: Diagnosis not present

## 2021-03-16 DIAGNOSIS — S0181XA Laceration without foreign body of other part of head, initial encounter: Secondary | ICD-10-CM | POA: Diagnosis not present

## 2021-03-16 DIAGNOSIS — M25561 Pain in right knee: Secondary | ICD-10-CM | POA: Insufficient documentation

## 2021-03-16 DIAGNOSIS — Z23 Encounter for immunization: Secondary | ICD-10-CM | POA: Diagnosis not present

## 2021-03-16 DIAGNOSIS — M79641 Pain in right hand: Secondary | ICD-10-CM | POA: Diagnosis not present

## 2021-03-16 DIAGNOSIS — S0081XA Abrasion of other part of head, initial encounter: Secondary | ICD-10-CM

## 2021-03-16 DIAGNOSIS — Z7901 Long term (current) use of anticoagulants: Secondary | ICD-10-CM | POA: Diagnosis not present

## 2021-03-16 IMAGING — DX DG KNEE 1-2V*R*
2 series · 2 of 2 positions shown · non-contrast
Comparison: None.

CLINICAL DATA: fall

EXAM:
RIGHT KNEE - 1-2 VIEW

[knee ap]
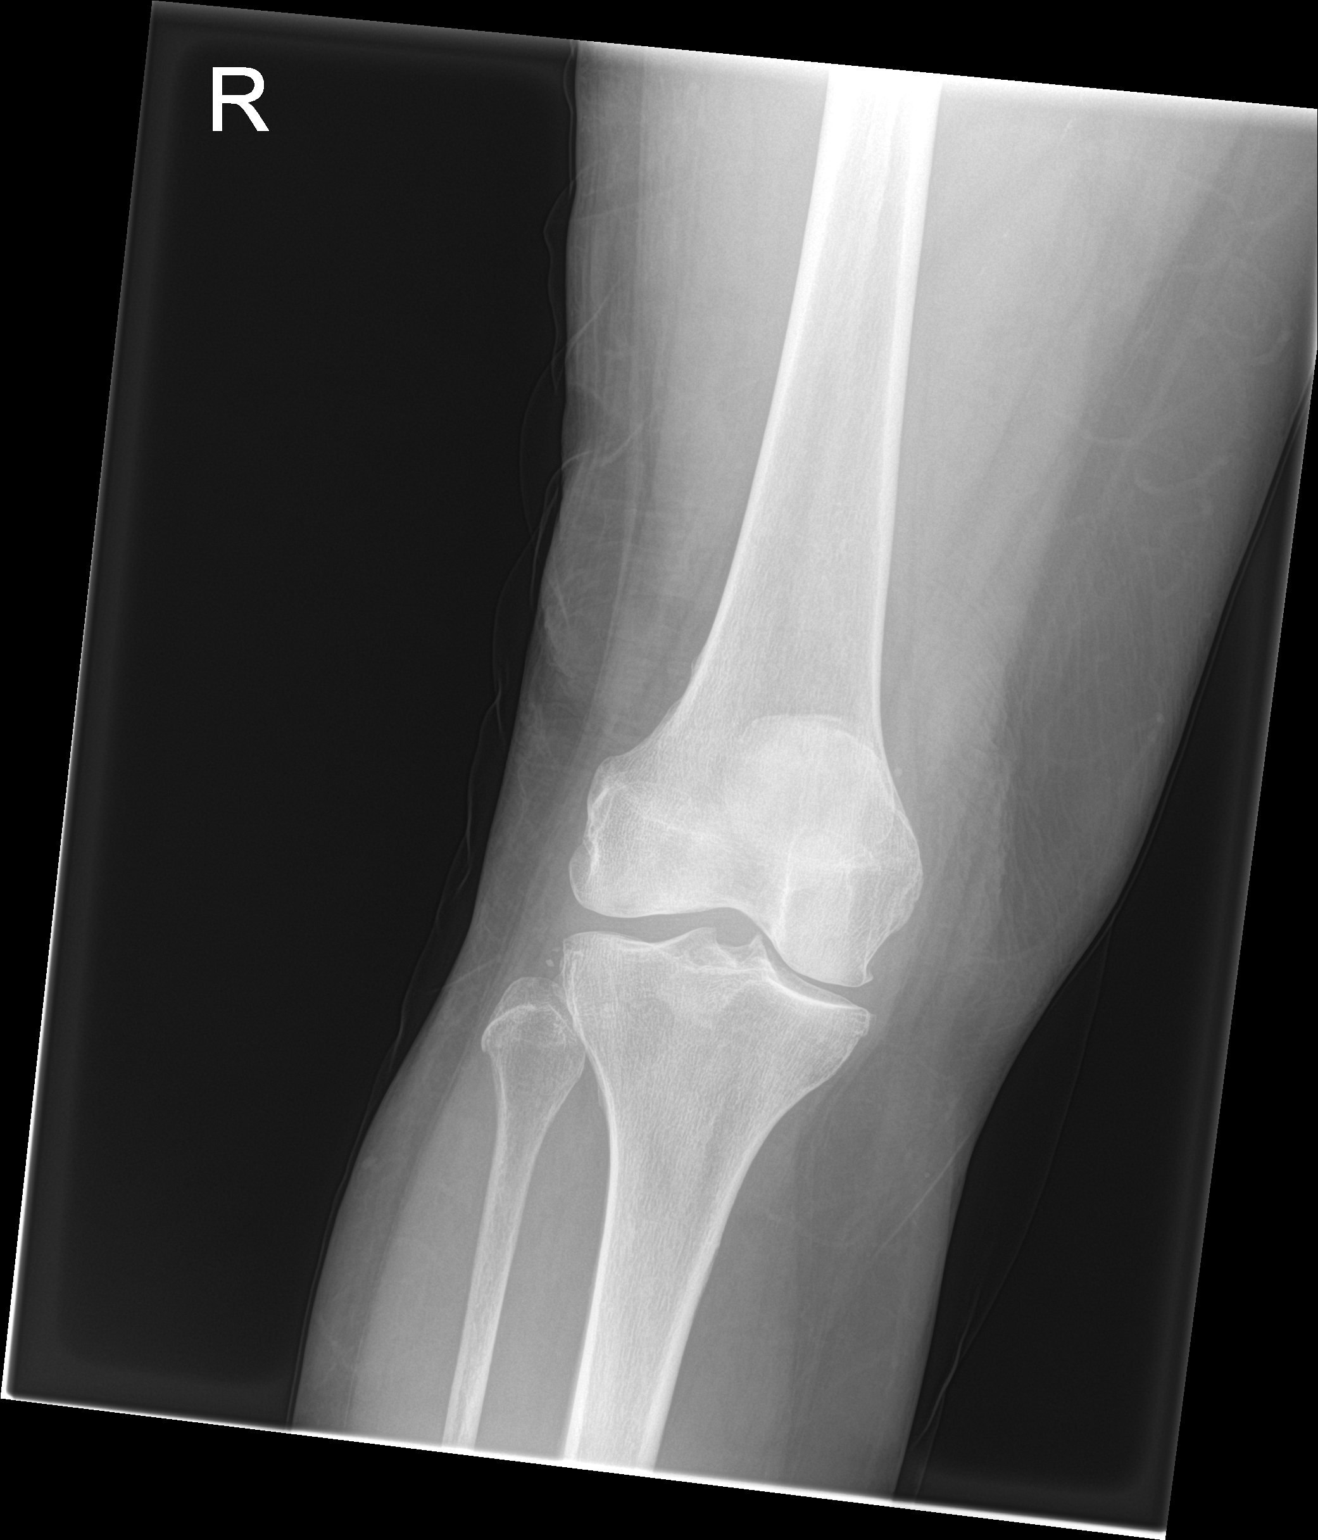

[knee lat]
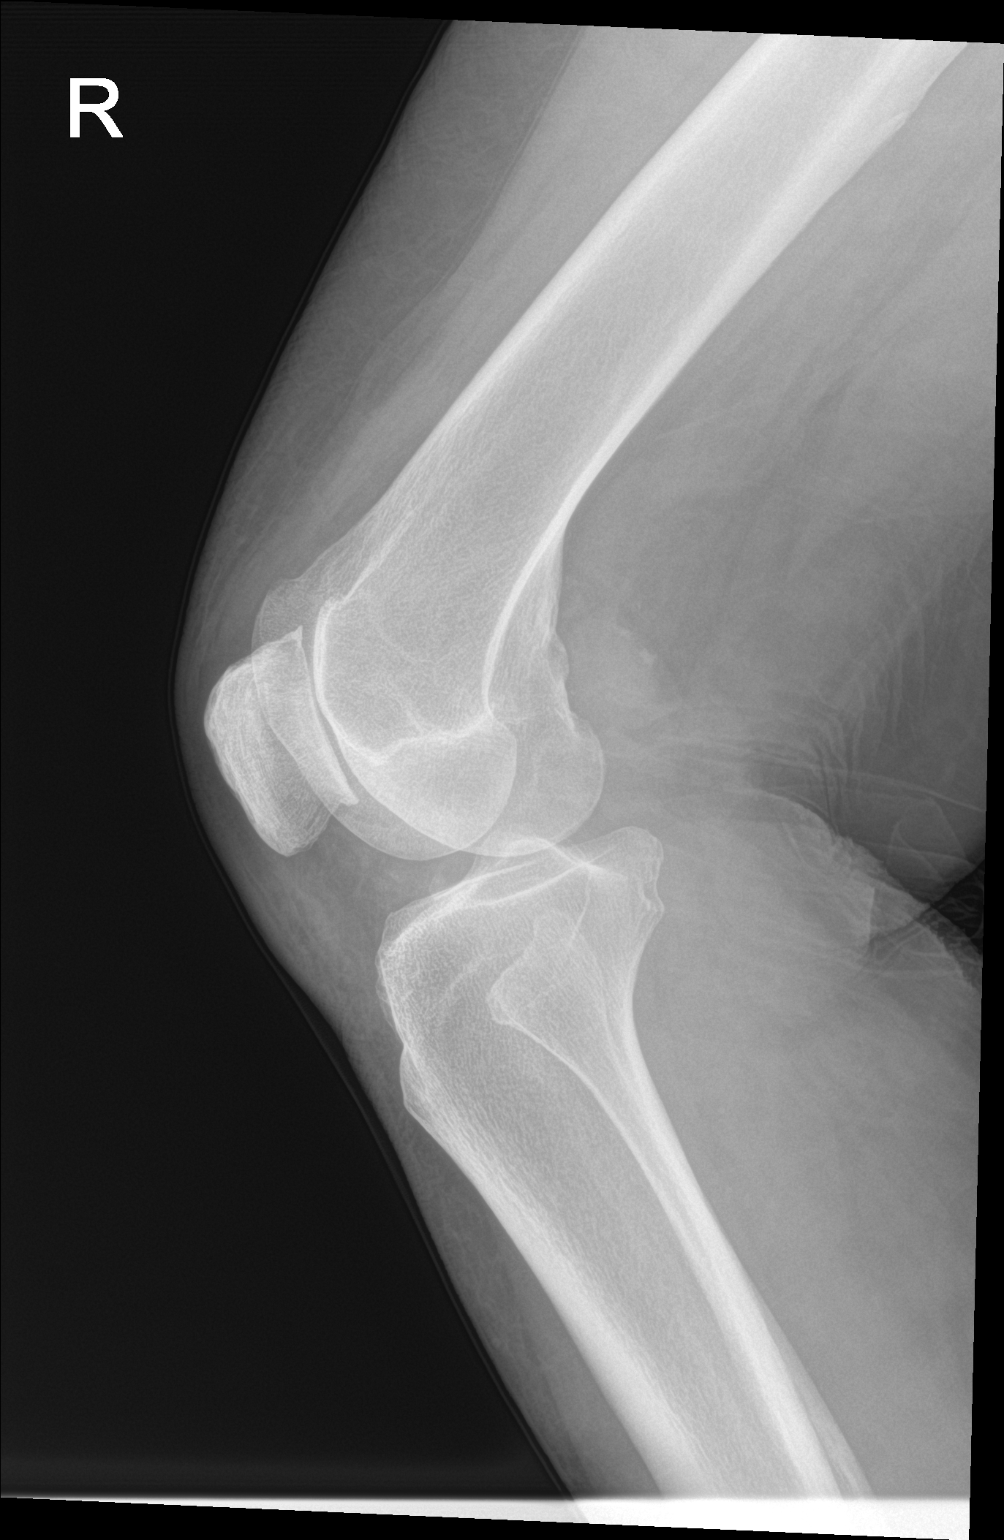

[2 of 2 positions shown; findings below may reference images not displayed]

FINDINGS: No evidence of fracture, dislocation, or joint effusion. No evidence
of arthropathy or other focal bone abnormality. Soft tissues are
unremarkable.
IMPRESSION: Negative.

## 2021-03-16 IMAGING — CT CT CERVICAL SPINE W/O CM
3 of 4 series · 12 of 33 positions shown, 14 images · non-contrast
Comparison: None.

CLINICAL DATA: Head trauma, minor (Age >= 65y) Anticoagulated;
Facial trauma, blunt; Neck trauma (Age >= 65y)



[Series 5: orthogonal axials · axial · 0.16mm/px · z∈[-275,-203]mm · 4 of 59 slices shown, 5 images]
[im 10/59  soft-tissue]
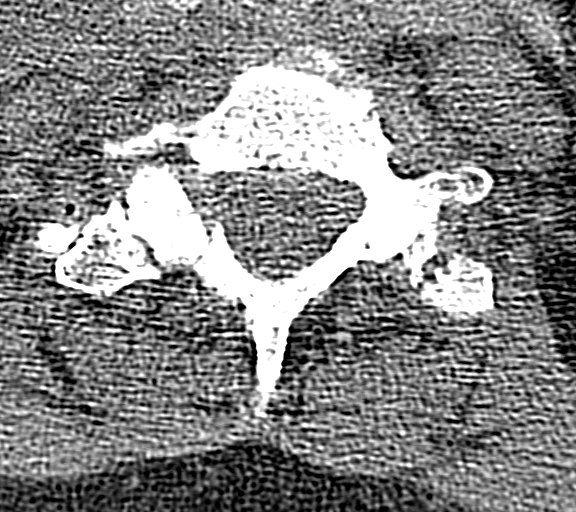
[im 10/59  bone]
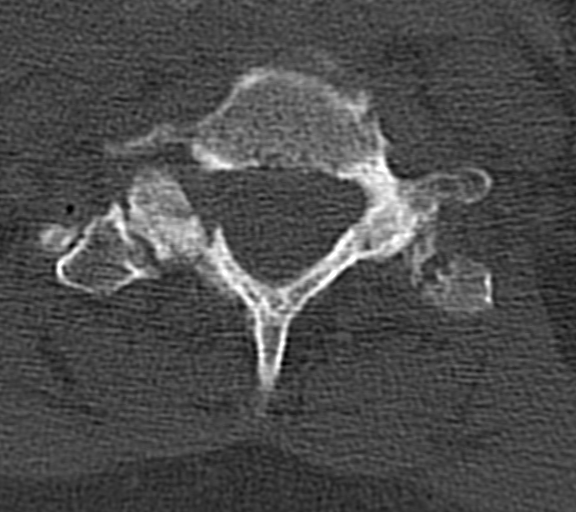
[im 20/59  bone]
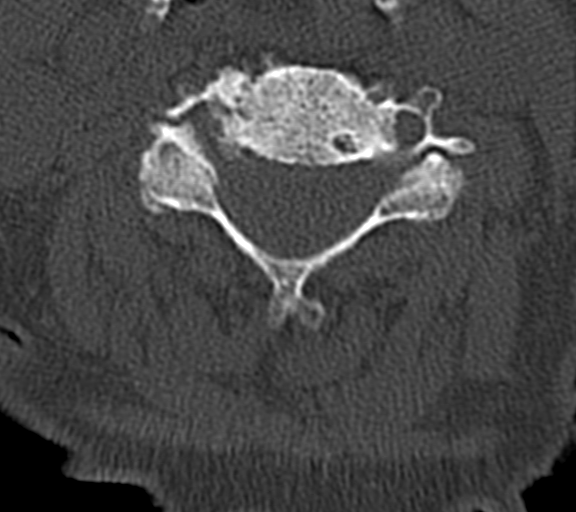
[im 39/59  bone]
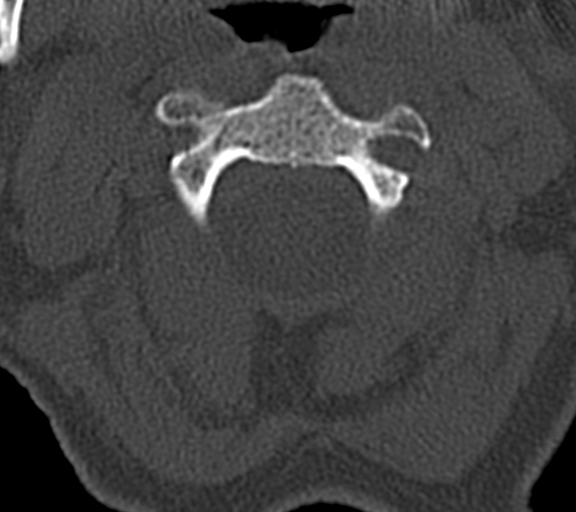
[im 49/59  bone]
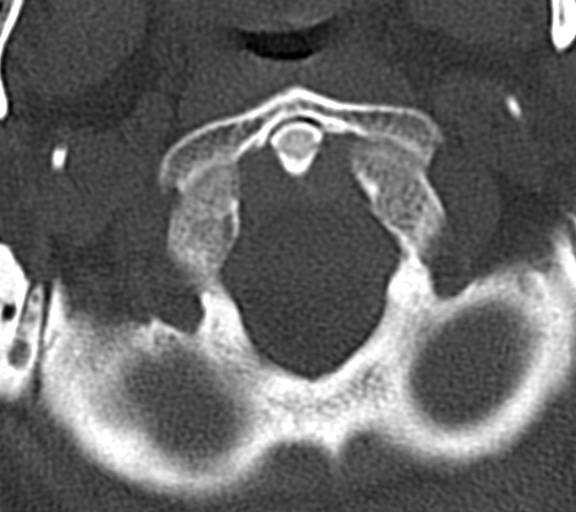

[Series 6: coronal bone · coronal · 0.18mm/px · 3 of 41 slices shown]
[im 9/41  bone]
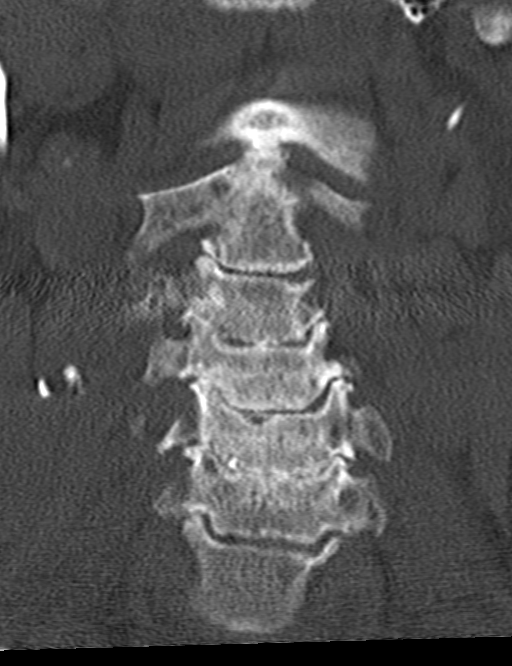
[im 17/41  bone]
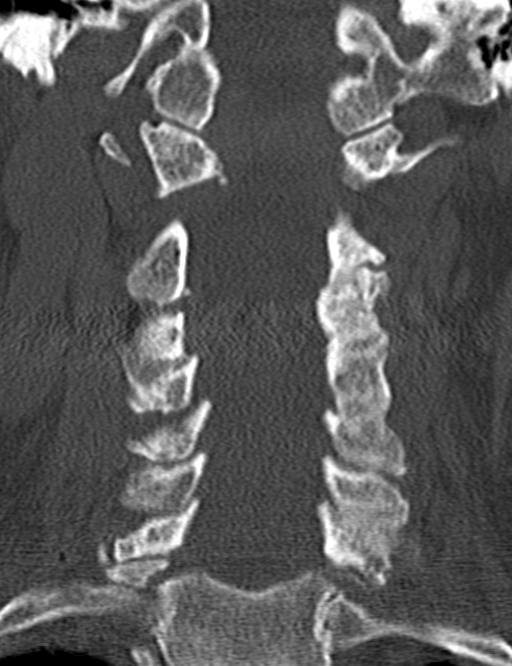
[im 25/41  bone]
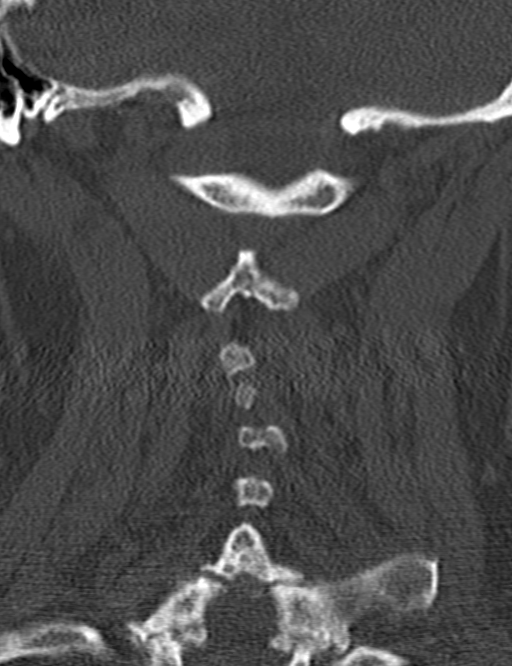

[Series 7: sagittal bone · sagittal · 0.16mm/px · 5 of 46 slices shown, 6 images]
[im 16/46  bone]
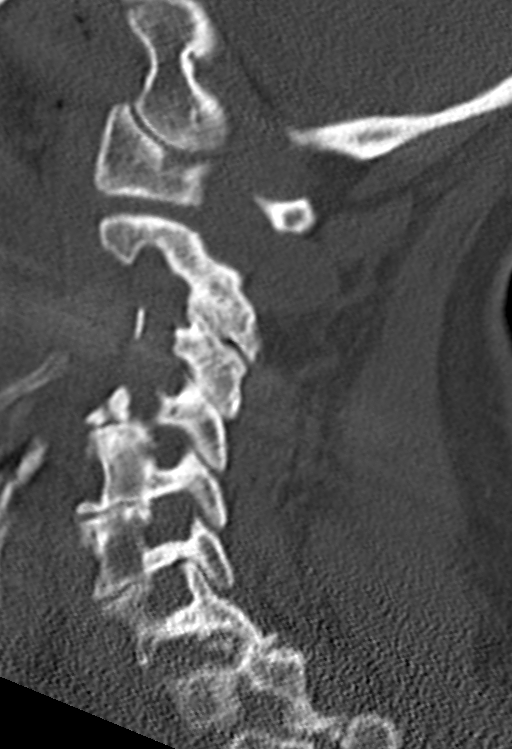
[im 19/46  bone]
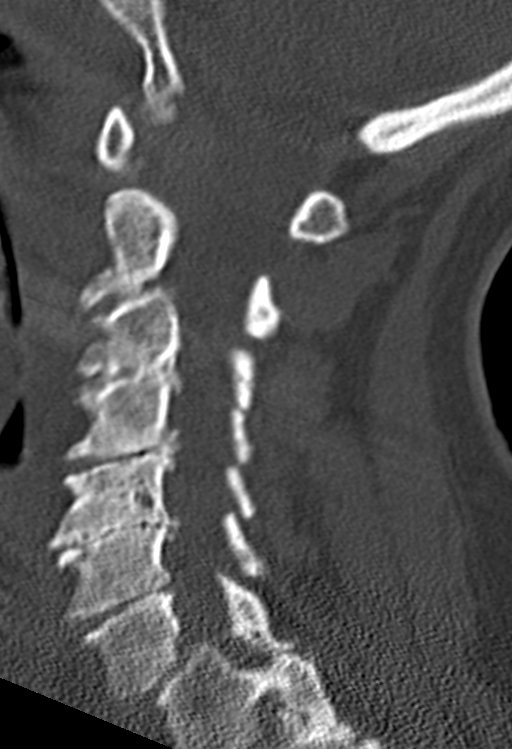
[im 23/46  soft-tissue]
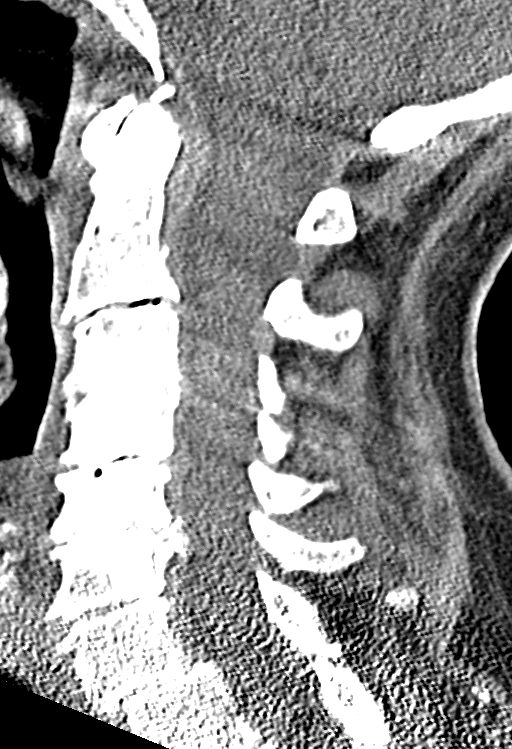
[im 23/46  bone]
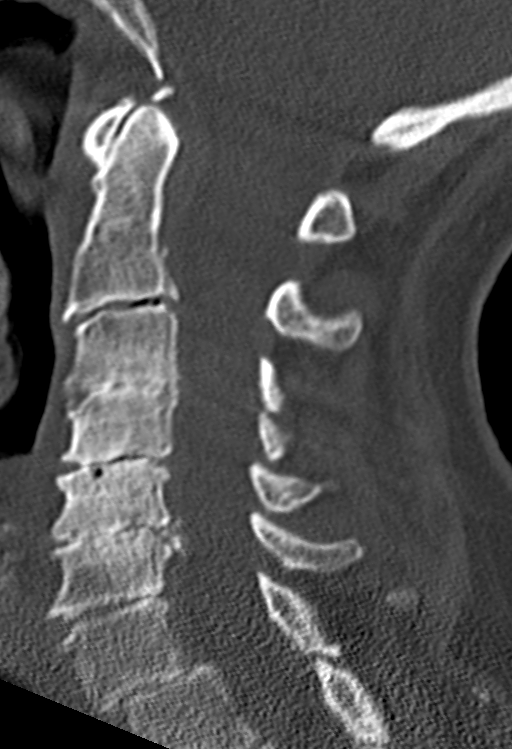
[im 27/46  bone]
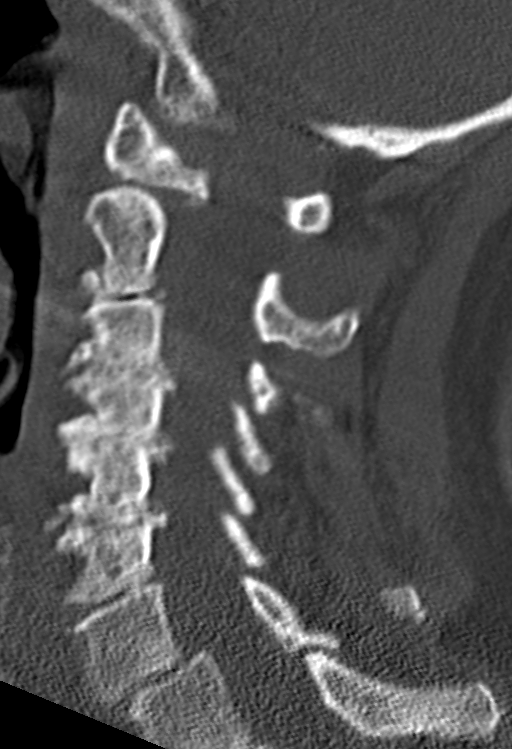
[im 31/46  bone]
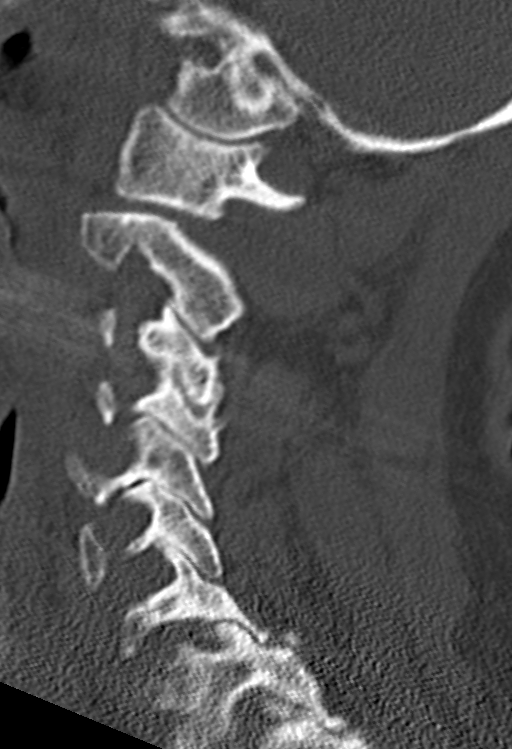

[12 of 33 positions shown; findings below may reference images not displayed]

FINDINGS: CT HEAD FINDINGS

BRAIN:
BRAIN
Patchy and confluent areas of decreased attenuation are noted
throughout the deep and periventricular white matter of the cerebral
hemispheres bilaterally, compatible with chronic microvascular
ischemic disease.

No evidence of large-territorial acute infarction. No parenchymal
hemorrhage. No mass lesion. No extra-axial collection.

No mass effect or midline shift. No hydrocephalus. Basilar cisterns
are patent.

Vascular: No hyperdense vessel.

Skull: No acute fracture or focal lesion.

Other: Mild left frontal scalp edema.  No large hematoma formation.

CT MAXILLOFACIAL FINDINGS

Osseous: No fracture or mandibular dislocation. No destructive
process.

Sinuses/Orbits: Paranasal sinuses and mastoid air cells are clear.
Bilateral lens replacement. Otherwise orbits are unremarkable.

Soft tissues: Negative.

CT CERVICAL SPINE FINDINGS

Alignment: Normal.

Skull base and vertebrae: Multilevel severe degenerative changes of
the spine. No acute fracture. No aggressive appearing focal osseous
lesion or focal pathologic process.

Soft tissues and spinal canal: No prevertebral fluid or swelling. No
visible canal hematoma.

Upper chest: Unremarkable.

Other: Atherosclerotic plaque of the carotid arteries within the
neck.
IMPRESSION: 1. No acute intracranial abnormality.
2. No acute displaced facial fracture.
3. No acute displaced fracture or traumatic listhesis of the
cervical spine.

## 2021-03-16 IMAGING — CT CT HEAD W/O CM
3 series · 15 of 47 positions shown, 18 images · non-contrast
Comparison: None.

CLINICAL DATA: Head trauma, minor (Age >= 65y) Anticoagulated;
Facial trauma, blunt; Neck trauma (Age >= 65y)



[Series 1: head wo · axial · 0.41mm/px · z∈[-187,-57]mm · 9 of 32 slices shown, 12 images]
[im 3/32  brain]
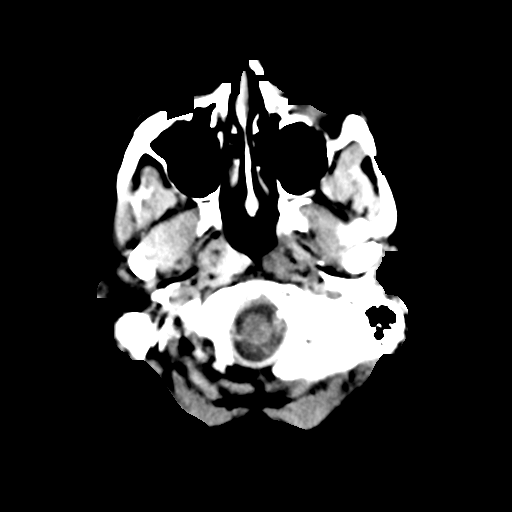
[im 3/32  bone]
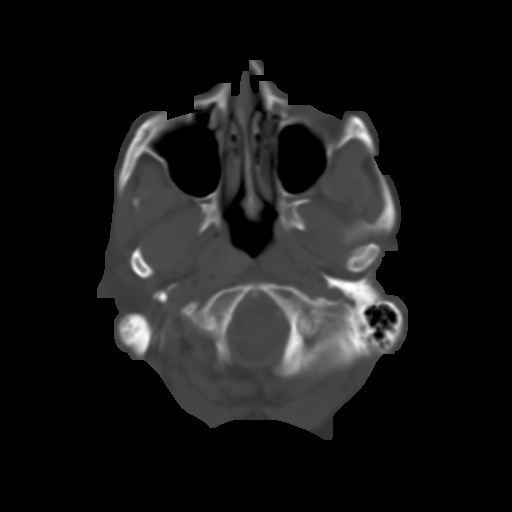
[im 6/32  brain]
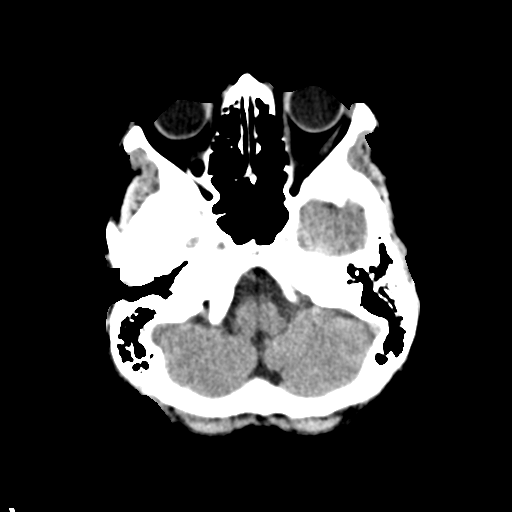
[im 9/32  brain]
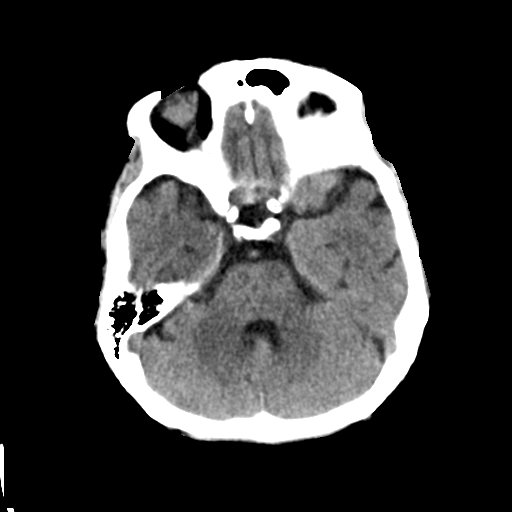
[im 12/32  brain]
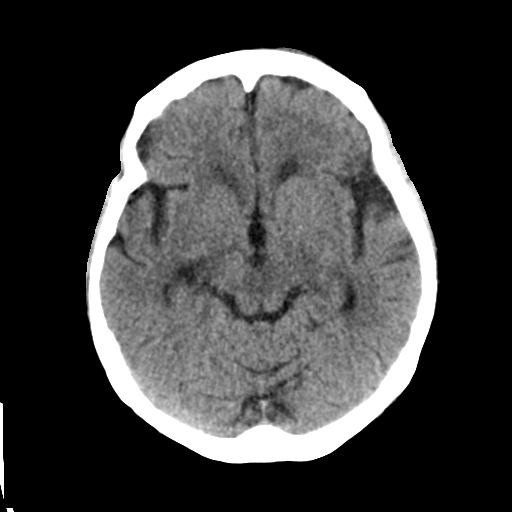
[im 17/32  brain]
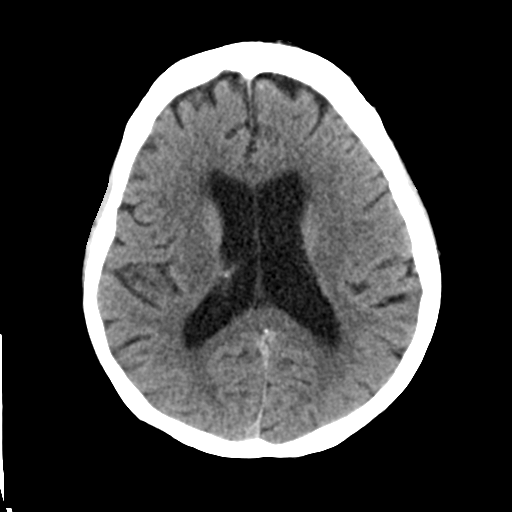
[im 17/32  bone]
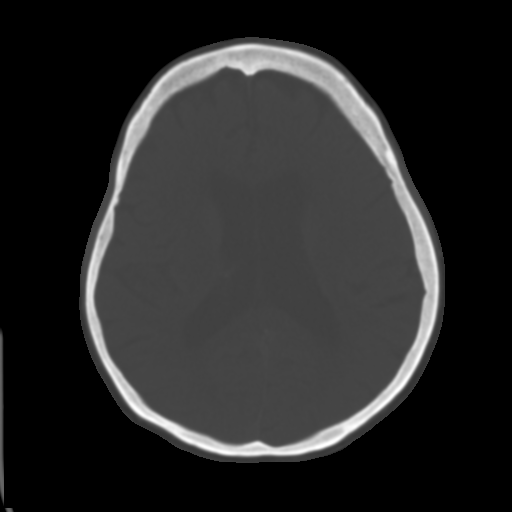
[im 20/32  brain]
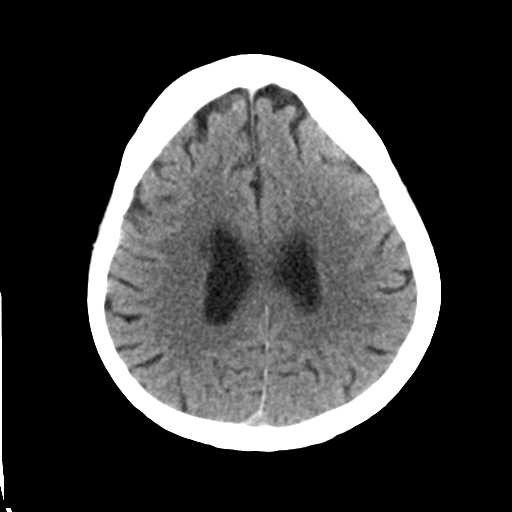
[im 23/32  brain]
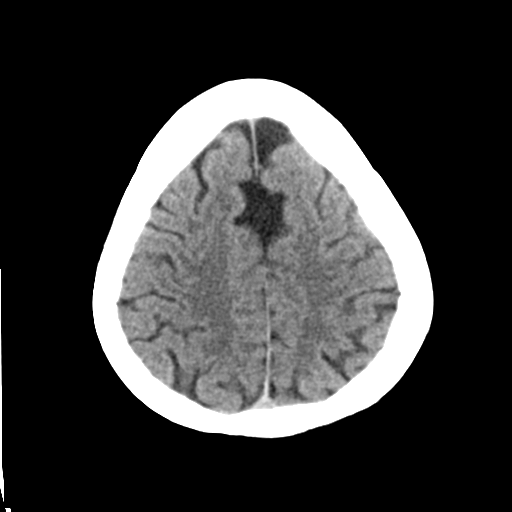
[im 26/32  brain]
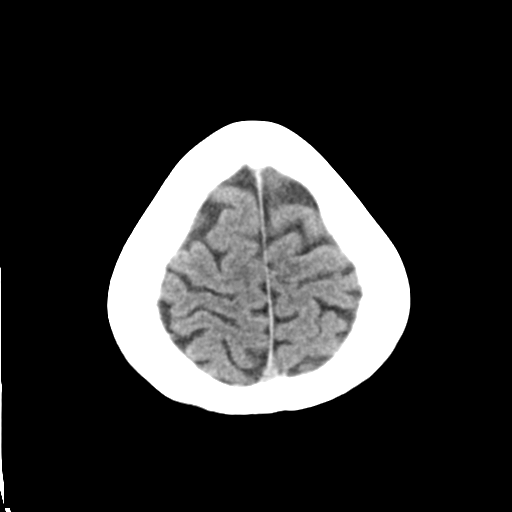
[im 29/32  brain]
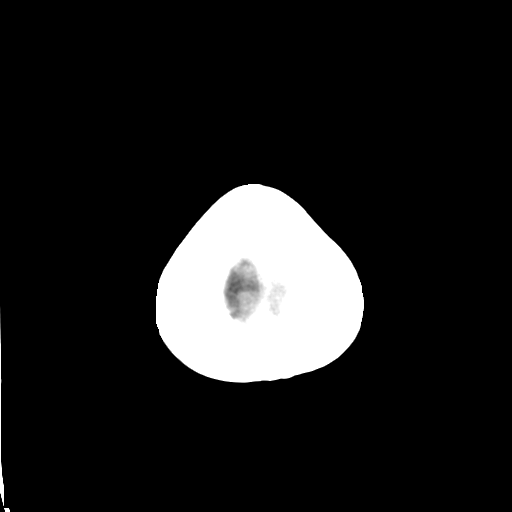
[im 29/32  bone]
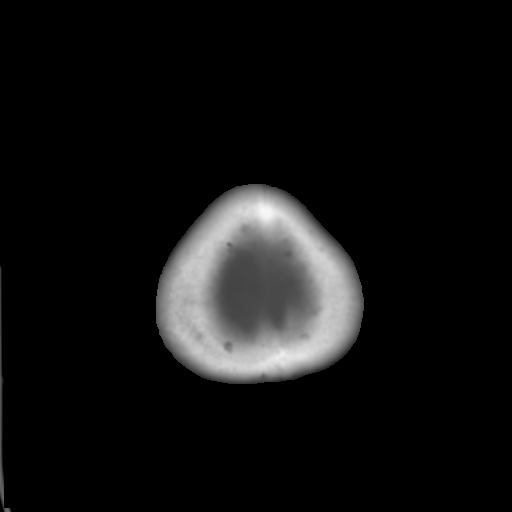

[Series 5: coronal soft tissue · coronal · 0.31mm/px · 3 of 62 slices shown]
[im 21/62  brain]
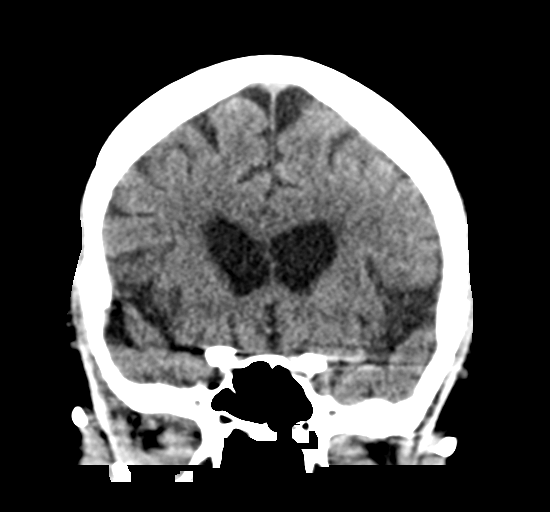
[im 28/62  brain]
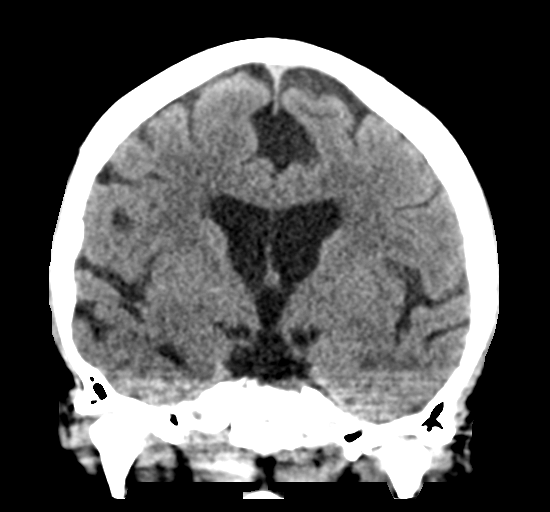
[im 34/62  brain]
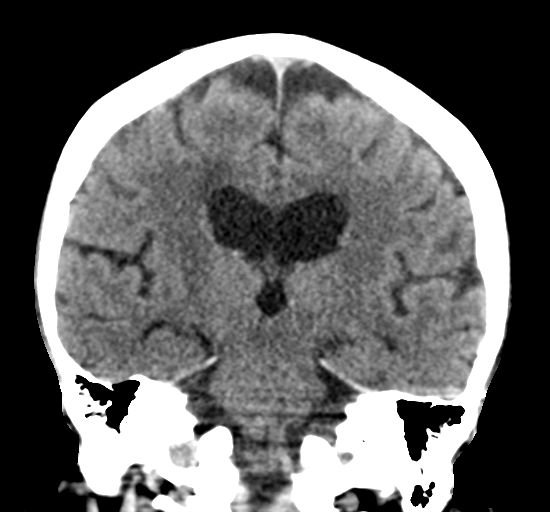

[Series 6: sagittal soft tissue · sagittal · 0.31mm/px · 3 of 58 slices shown]
[im 20/58  brain]
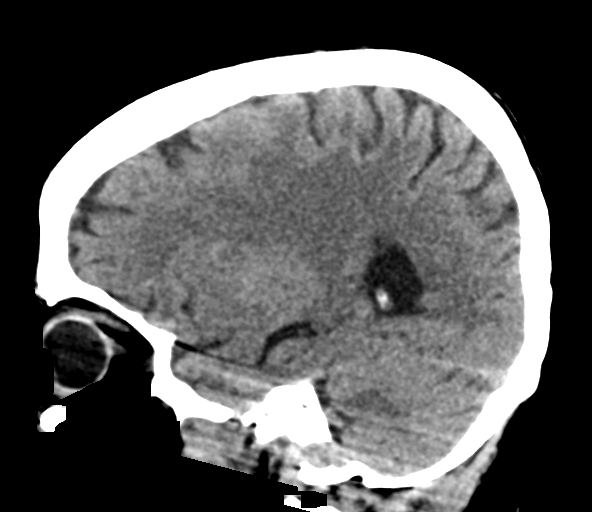
[im 29/58  brain]
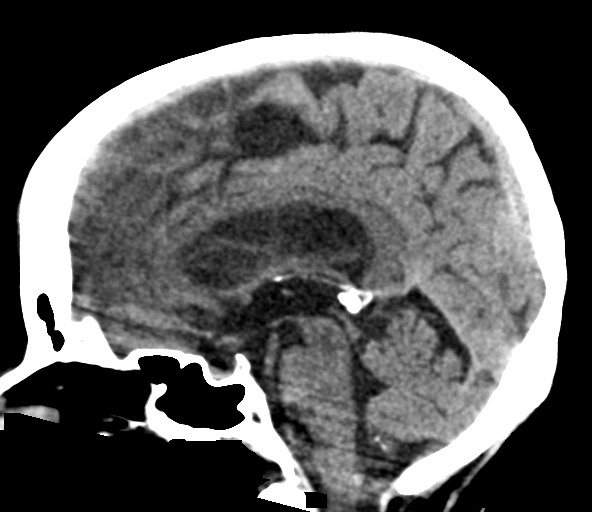
[im 39/58  brain]
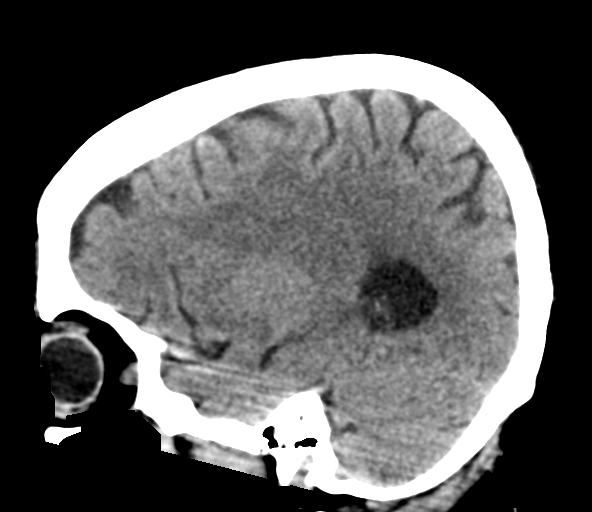

[15 of 47 positions shown; findings below may reference images not displayed]

FINDINGS: CT HEAD FINDINGS

BRAIN:
BRAIN
Patchy and confluent areas of decreased attenuation are noted
throughout the deep and periventricular white matter of the cerebral
hemispheres bilaterally, compatible with chronic microvascular
ischemic disease.

No evidence of large-territorial acute infarction. No parenchymal
hemorrhage. No mass lesion. No extra-axial collection.

No mass effect or midline shift. No hydrocephalus. Basilar cisterns
are patent.

Vascular: No hyperdense vessel.

Skull: No acute fracture or focal lesion.

Other: Mild left frontal scalp edema.  No large hematoma formation.

CT MAXILLOFACIAL FINDINGS

Osseous: No fracture or mandibular dislocation. No destructive
process.

Sinuses/Orbits: Paranasal sinuses and mastoid air cells are clear.
Bilateral lens replacement. Otherwise orbits are unremarkable.

Soft tissues: Negative.

CT CERVICAL SPINE FINDINGS

Alignment: Normal.

Skull base and vertebrae: Multilevel severe degenerative changes of
the spine. No acute fracture. No aggressive appearing focal osseous
lesion or focal pathologic process.

Soft tissues and spinal canal: No prevertebral fluid or swelling. No
visible canal hematoma.

Upper chest: Unremarkable.

Other: Atherosclerotic plaque of the carotid arteries within the
neck.
IMPRESSION: 1. No acute intracranial abnormality.
2. No acute displaced facial fracture.
3. No acute displaced fracture or traumatic listhesis of the
cervical spine.

## 2021-03-16 IMAGING — DX DG HAND 2V*R*
2 series · 2 of 2 positions shown · non-contrast
Comparison: None.

CLINICAL DATA: Fall.

EXAM:
RIGHT HAND - 2 VIEW

[hand ap]
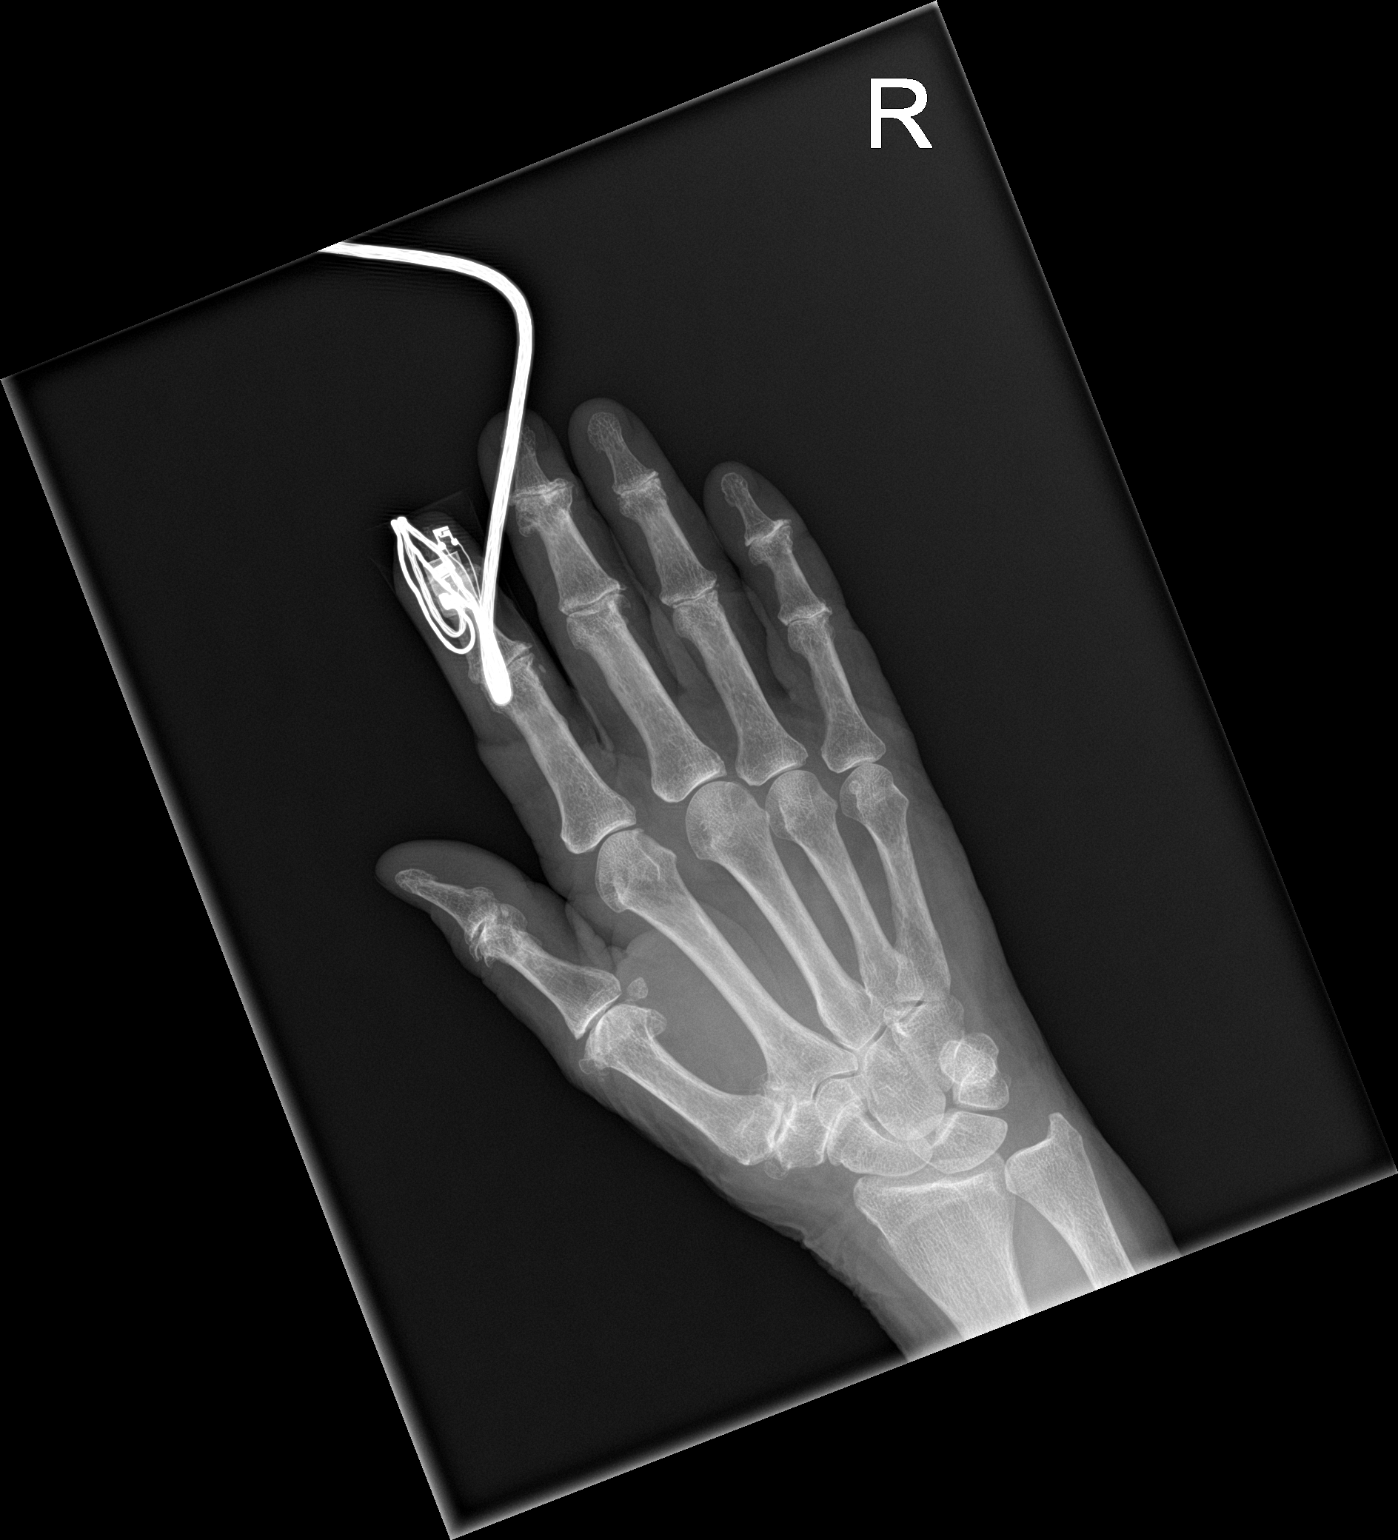

[hand lat]
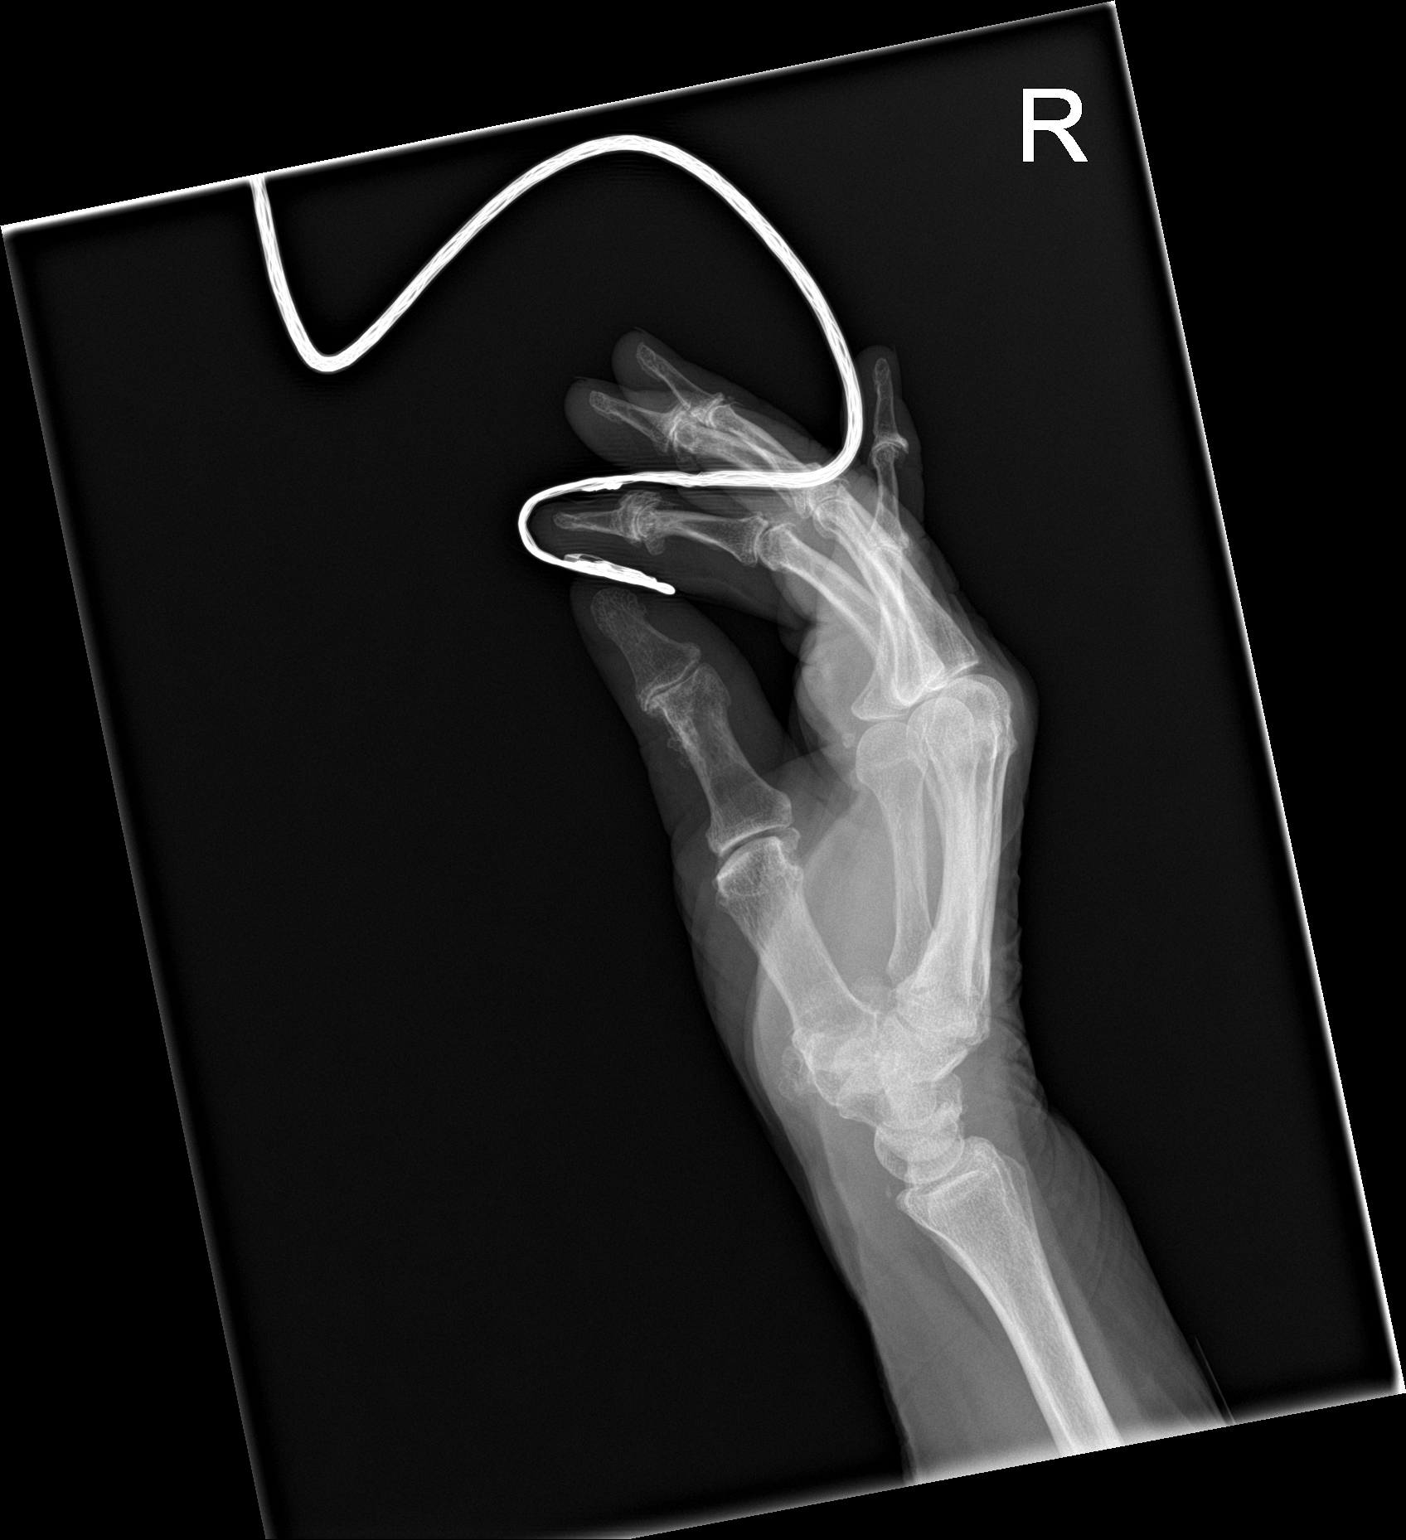

[2 of 2 positions shown; findings below may reference images not displayed]

FINDINGS: There is no evidence for acute fracture or dislocation. There are
degenerative changes of interphalangeal joints diffusely, severe at
the second and third distal interphalangeal joints. There also
moderate degenerative changes of the first carpometacarpal joint
with joint space narrowing and osteophyte formation. Soft tissues
are within normal limits.
IMPRESSION: 1. No acute bony abnormality.
2. Degenerative changes. Pattern of distribution suggest
osteoarthritis.

## 2021-03-16 IMAGING — CT CT MAXILLOFACIAL W/O CM
4 of 5 series · 16 of 47 positions shown, 18 images · non-contrast
Comparison: None.

CLINICAL DATA: Head trauma, minor (Age >= 65y) Anticoagulated;
Facial trauma, blunt; Neck trauma (Age >= 65y)



[Series 3: max bone · axial · 0.33mm/px · z∈[-268,-152]mm · 8 of 76 slices shown, 10 images]
[im 9/76  brain]
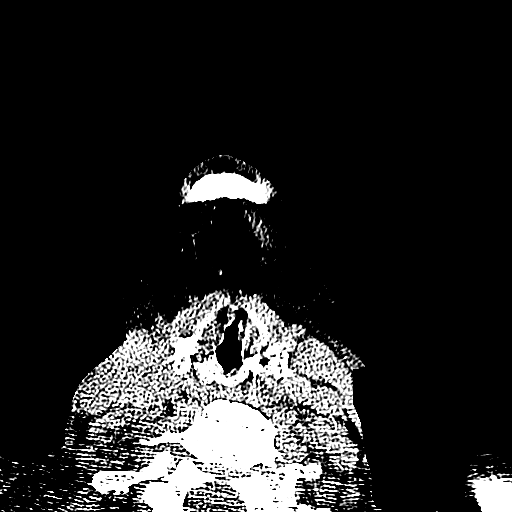
[im 9/76  bone]
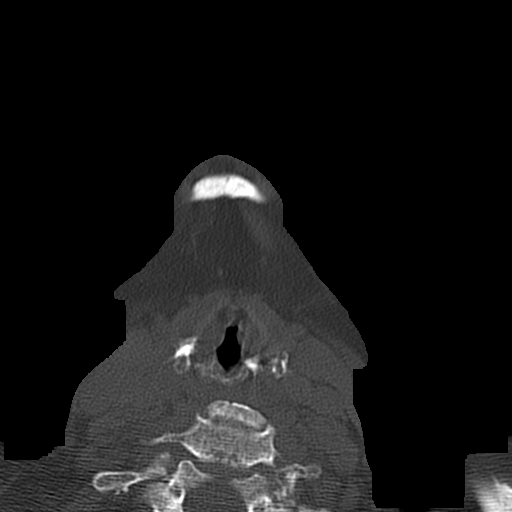
[im 17/76  bone]
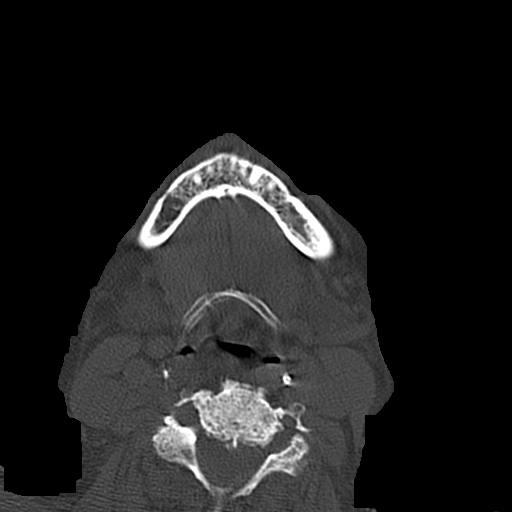
[im 26/76  bone]
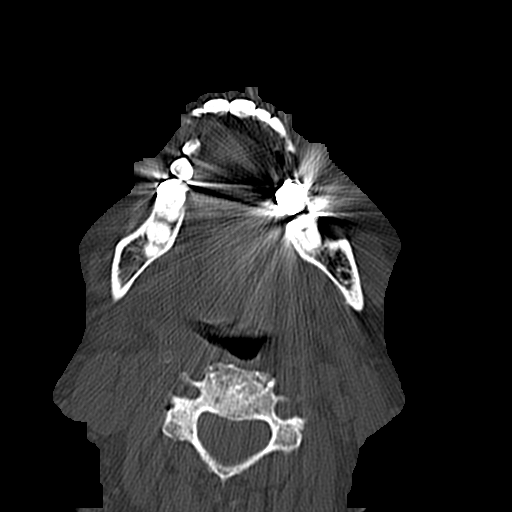
[im 34/76  bone]
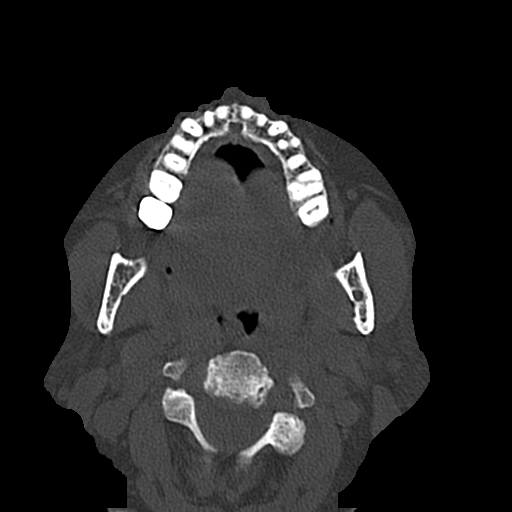
[im 42/76  brain]
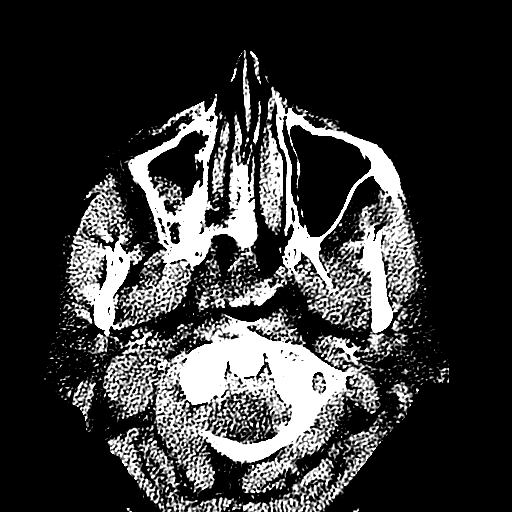
[im 42/76  bone]
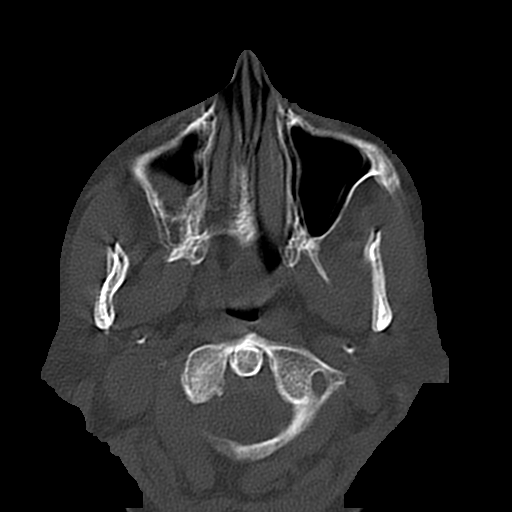
[im 51/76  bone]
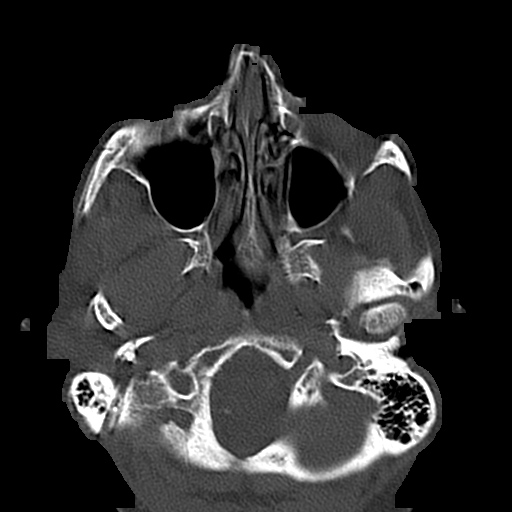
[im 59/76  bone]
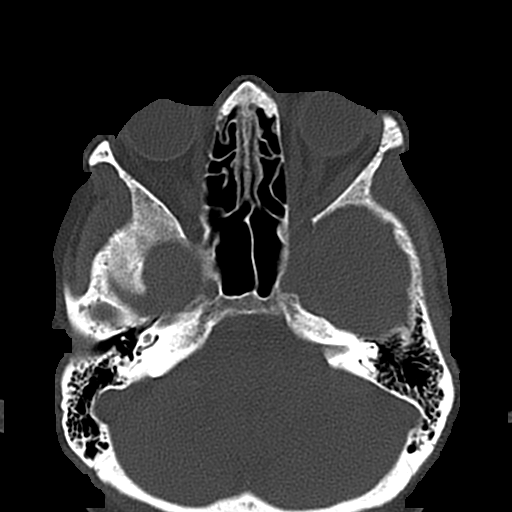
[im 67/76  bone]
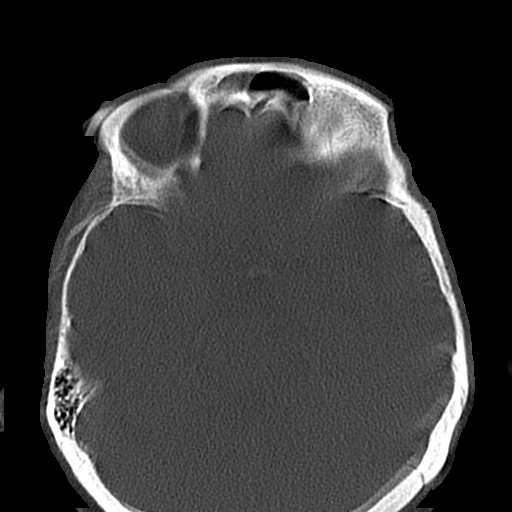

[Series 5: max soft · axial · 0.33mm/px · z∈[-269,-253]mm · 2 of 81 slices shown]
[im 9/81  brain]
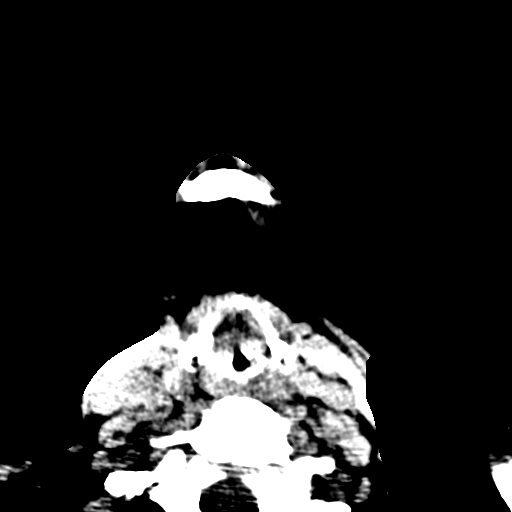
[im 17/81  brain]
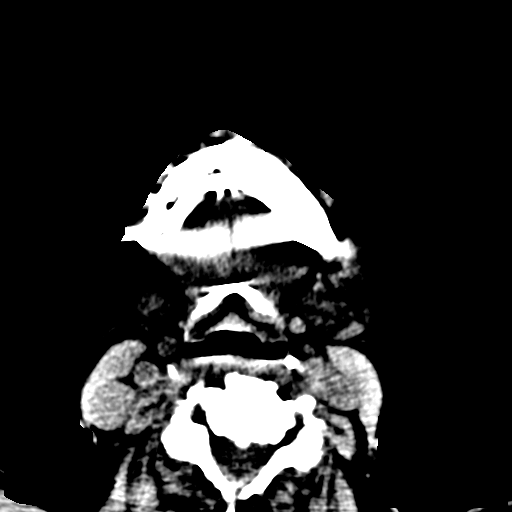

[Series 8: coronal soft · coronal · 0.36mm/px · 3 of 76 slices shown]
[im 26/76  bone]
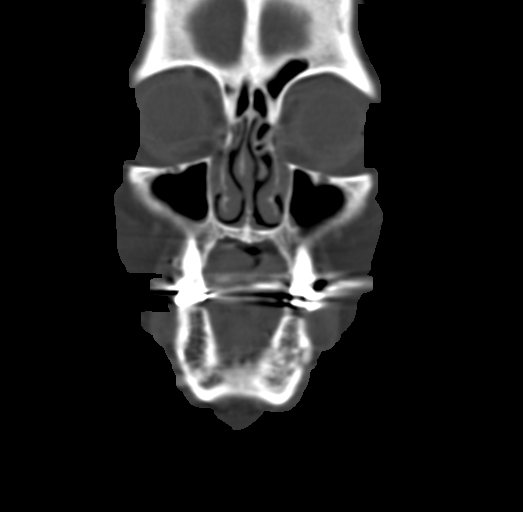
[im 34/76  bone]
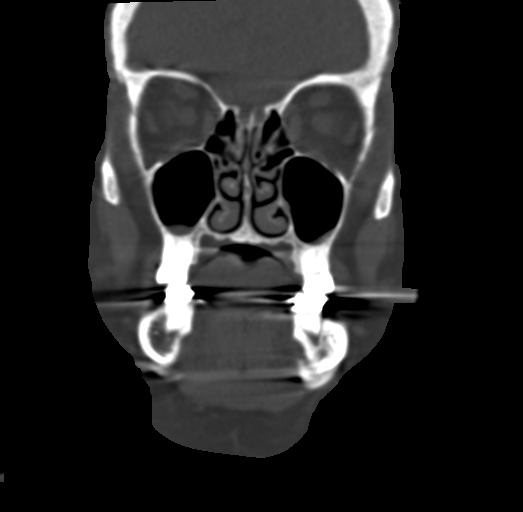
[im 42/76  bone]
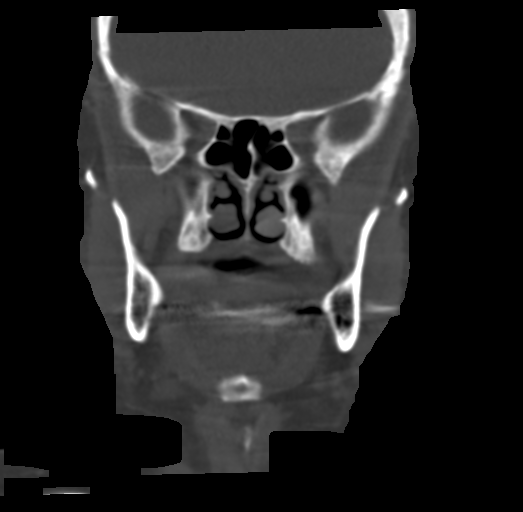

[Series 10: sagittal soft · sagittal · 0.29mm/px · 3 of 70 slices shown]
[im 24/70  bone]
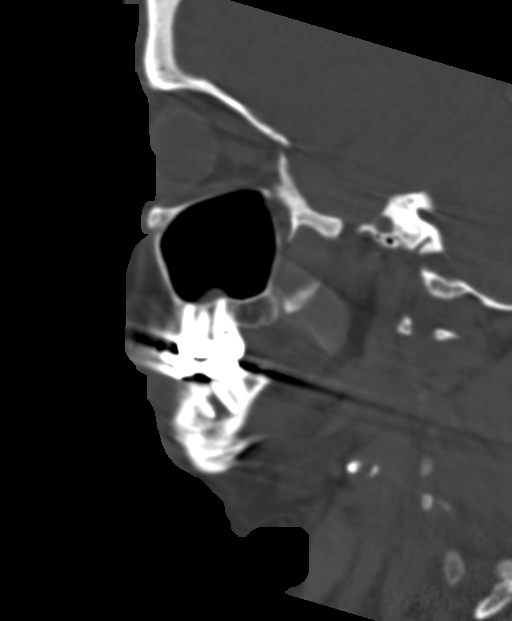
[im 35/70  bone]
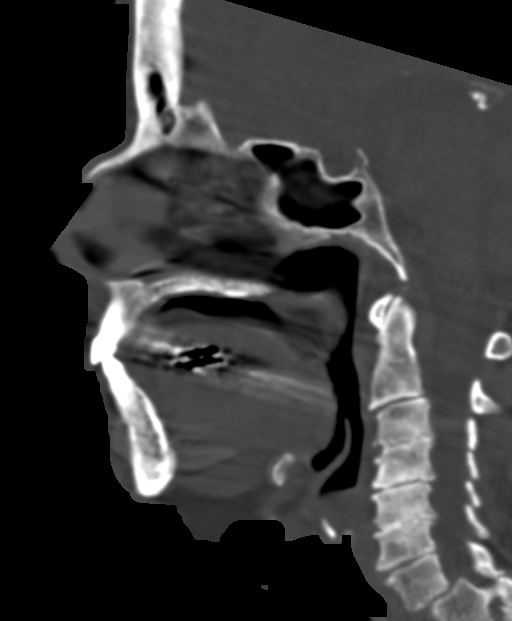
[im 47/70  bone]
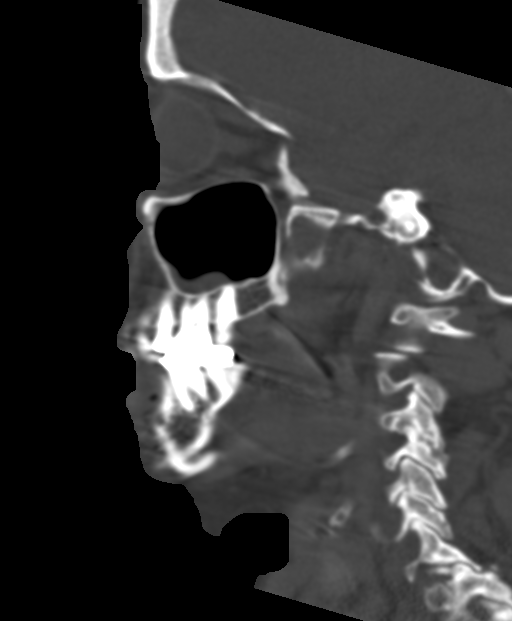

[16 of 47 positions shown; findings below may reference images not displayed]

FINDINGS: CT HEAD FINDINGS

BRAIN:
BRAIN
Patchy and confluent areas of decreased attenuation are noted
throughout the deep and periventricular white matter of the cerebral
hemispheres bilaterally, compatible with chronic microvascular
ischemic disease.

No evidence of large-territorial acute infarction. No parenchymal
hemorrhage. No mass lesion. No extra-axial collection.

No mass effect or midline shift. No hydrocephalus. Basilar cisterns
are patent.

Vascular: No hyperdense vessel.

Skull: No acute fracture or focal lesion.

Other: Mild left frontal scalp edema.  No large hematoma formation.

CT MAXILLOFACIAL FINDINGS

Osseous: No fracture or mandibular dislocation. No destructive
process.

Sinuses/Orbits: Paranasal sinuses and mastoid air cells are clear.
Bilateral lens replacement. Otherwise orbits are unremarkable.

Soft tissues: Negative.

CT CERVICAL SPINE FINDINGS

Alignment: Normal.

Skull base and vertebrae: Multilevel severe degenerative changes of
the spine. No acute fracture. No aggressive appearing focal osseous
lesion or focal pathologic process.

Soft tissues and spinal canal: No prevertebral fluid or swelling. No
visible canal hematoma.

Upper chest: Unremarkable.

Other: Atherosclerotic plaque of the carotid arteries within the
neck.
IMPRESSION: 1. No acute intracranial abnormality.
2. No acute displaced facial fracture.
3. No acute displaced fracture or traumatic listhesis of the
cervical spine.

## 2021-03-16 MED ORDER — LIDOCAINE-EPINEPHRINE (PF) 2 %-1:200000 IJ SOLN
10.0000 mL | Freq: Once | INTRAMUSCULAR | Status: AC
  Administered 2021-03-16: 10 mL
  Filled 2021-03-16: qty 20

## 2021-03-16 MED ORDER — HYDROCODONE-ACETAMINOPHEN 5-325 MG PO TABS: 2.0000 | ORAL_TABLET | ORAL | 0 refills | Status: DC | PRN

## 2021-03-16 MED ORDER — TETANUS-DIPHTH-ACELL PERTUSSIS 5-2.5-18.5 LF-MCG/0.5 IM SUSY
0.5000 mL | PREFILLED_SYRINGE | Freq: Once | INTRAMUSCULAR | Status: AC
  Administered 2021-03-16: 0.5 mL via INTRAMUSCULAR
  Filled 2021-03-16: qty 0.5

## 2021-03-16 MED ORDER — ACETAMINOPHEN 500 MG PO TABS
1000.0000 mg | ORAL_TABLET | Freq: Once | ORAL | Status: AC
  Administered 2021-03-16: 1000 mg via ORAL
  Filled 2021-03-16: qty 2

## 2021-03-16 NOTE — ED Triage Notes (Signed)
Patient presents after tripping over the curb and hitting her face on the ground. Patient noted to have pain to face with wound and bleeding, nasal pain, left arm and shoulder pain, with knee pain. Patient alert, oriented and ambulatory with assistance,

## 2021-03-16 NOTE — Discharge Instructions (Addendum)
Return for any problem.  Dressing applied over your repaired laceration on your forehead may be removed in 24 hours.  Sutures placed today are absorbable.  They will fall out on their own.  There is no need for them to be removed.

## 2021-03-16 NOTE — ED Provider Notes (Signed)
[facility] DEPT Provider Note   CSN: [id number] Arrival date & time: 03/16/21  2010     History  Chief Complaint  Patient presents with   Fall   Head Injury    On ELIQUIS    Elizabeth Barnett is a 81 y.o. female. Chief Complaint  Patient presents with   Fall   Head Injury      On ELIQUIS      Elizabeth Barnett is a 81 y.o. female.   81 year old female with prior medical history as detailed below presents for evaluation.  Patient reports tripping and falling on a curb this evening.  She landed on her face.  She has painful abrasion to her nose.  She has abrasions and lacerations to her forehead.  She did not pass out.  She denies neck pain.  She complains of mild pain to the right hand and right knee.   She is on Eliquis.  She is compliant with same. She denies other injuries from the fall tonight.   She and her husband are on the way to see the play "[place]."   The history is provided by the patient, medical records and a significant other.  Fall This is a new problem. The current episode started less than 1 hour ago. The problem occurs constantly. The problem has not changed since onset.Pertinent negatives include no chest pain and no abdominal pain. Nothing aggravates the symptoms. Nothing relieves the symptoms.       Home Medications Prior to Admission medications   Medication Sig Start Date End Date Taking? Authorizing Provider  HYDROcodone-acetaminophen (NORCO/VICODIN) 5-325 MG tablet Take 2 tablets by mouth every 4 (four) hours as needed. 03/16/21  Yes Valarie Merino, MD  atorvastatin (LIPITOR) 20 MG tablet Take 20 mg by mouth daily.    [provider]  dexlansoprazole (DEXILANT) 60 MG capsule Take 60 mg by mouth daily.    [provider]  losartan-hydrochlorothiazide (HYZAAR) 100-25 MG per tablet Take 1 tablet by mouth daily.    [provider]  polyethylene glycol (MIRALAX / GLYCOLAX) packet Take 17 g by mouth  daily.    [provider]  predniSONE (DELTASONE) 50 MG tablet Take 50mg  prednisone on 02/29/20 @ 9:20pm.  Take 50mg  prednisone on 03/01/20 @ 3:20am.  Take 50mg  prenisone on 03/01/20 @ 9:20am AND 50mg  benadryl. 02/27/20   Nelson Chimes, MD  sucralfate (CARAFATE) 1 G tablet Take 1 g by mouth 4 (four) times daily -  with meals and at bedtime.    [provider]      Allergies    Bee venom, Voltaren [diclofenac sodium], Ativan [lorazepam], Contrast media [iodinated contrast media], and Meloxicam    Review of Systems   Review of Systems  All other systems reviewed and are negative.  Physical Exam Updated Vital Signs BP 120/80 (BP Location: Left Arm)    Pulse 65    Temp 98 F (36.7 C) (Oral)    Resp 16    Ht 4\' 11"  (1.499 m)    Wt 53.1 kg    SpO2 100%    BMI 23.63 kg/m  Physical Exam Vitals and nursing note reviewed.  Constitutional:      General: She is not in acute distress.    Appearance: Normal appearance. She is well-developed.  HENT:     Head: Normocephalic.     Comments: Abrasions to the bridge of the nose.   3 cm abrasion in the middle of the forehead  with 1 cm laceration internal to the abraded area.     Eyes:     Conjunctiva/sclera: Conjunctivae normal.     Pupils: Pupils are equal, round, and reactive to light.  Cardiovascular:     Rate and Rhythm: Normal rate and regular rhythm.     Heart sounds: Normal heart sounds.  Pulmonary:     Effort: Pulmonary effort is normal. No respiratory distress.     Breath sounds: Normal breath sounds.  Abdominal:     General: There is no distension.     Palpations: Abdomen is soft.     Tenderness: There is no abdominal tenderness.  Musculoskeletal:        General: No deformity. Normal range of motion.     Cervical back: Normal range of motion and neck supple.  Skin:    General: Skin is warm and dry.  Neurological:     General: No focal deficit present.     Mental Status: She is alert and oriented to person, place,  and time. Mental status is at baseline.     Cranial Nerves: No cranial nerve deficit.     Sensory: No sensory deficit.     Motor: No weakness.     Coordination: Coordination normal.  ED Results / Procedures / Treatments   Labs (all labs ordered are listed, but only abnormal results are displayed) Labs Reviewed - No data to display  EKG None  Radiology DG Knee 2 Views Right  Result Date: 03/16/2021 CLINICAL DATA:  fall EXAM: RIGHT KNEE - 1-2 VIEW COMPARISON:  None. FINDINGS: No evidence of fracture, dislocation, or joint effusion. No evidence of arthropathy or other focal bone abnormality. Soft tissues are unremarkable. IMPRESSION: Negative. Electronically Signed   By: Tish Frederickson M.D.   On: 03/16/2021 21:43   CT Head Wo Contrast  Result Date: 03/16/2021 CLINICAL DATA:  Head trauma, minor (Age >= 65y) Anticoagulated; Facial trauma, blunt; Neck trauma (Age >= 65y) EXAM: CT HEAD WITHOUT CONTRAST CT MAXILLOFACIAL WITHOUT CONTRAST CT CERVICAL SPINE WITHOUT CONTRAST TECHNIQUE: Multidetector CT imaging of the head, cervical spine, and maxillofacial structures were performed using the standard protocol without intravenous contrast. Multiplanar CT image reconstructions of the cervical spine and maxillofacial structures were also generated. RADIATION DOSE REDUCTION: This exam was performed according to the departmental dose-optimization program which includes automated exposure control, adjustment of the mA and/or kV according to patient size and/or use of iterative reconstruction technique. COMPARISON:  None. FINDINGS: CT HEAD FINDINGS BRAIN: BRAIN Patchy and confluent areas of decreased attenuation are noted throughout the deep and periventricular white matter of the cerebral hemispheres bilaterally, compatible with chronic microvascular ischemic disease. No evidence of large-territorial acute infarction. No parenchymal hemorrhage. No mass lesion. No extra-axial collection. No mass effect or midline  shift. No hydrocephalus. Basilar cisterns are patent. Vascular: No hyperdense vessel. Skull: No acute fracture or focal lesion. Other: Mild left frontal scalp edema.  No large hematoma formation. CT MAXILLOFACIAL FINDINGS Osseous: No fracture or mandibular dislocation. No destructive process. Sinuses/Orbits: Paranasal sinuses and mastoid air cells are clear. Bilateral lens replacement. Otherwise orbits are unremarkable. Soft tissues: Negative. CT CERVICAL SPINE FINDINGS Alignment: Normal. Skull base and vertebrae: Multilevel severe degenerative changes of the spine. No acute fracture. No aggressive appearing focal osseous lesion or focal pathologic process. Soft tissues and spinal canal: No prevertebral fluid or swelling. No visible canal hematoma. Upper chest: Unremarkable. Other: Atherosclerotic plaque of the carotid arteries within the neck. IMPRESSION: 1. No acute intracranial abnormality. 2. No  acute displaced facial fracture. 3. No acute displaced fracture or traumatic listhesis of the cervical spine. Electronically Signed   By: Iven Finn M.D.   On: 03/16/2021 20:40   CT Cervical Spine Wo Contrast  Result Date: 03/16/2021 CLINICAL DATA:  Head trauma, minor (Age >= 65y) Anticoagulated; Facial trauma, blunt; Neck trauma (Age >= 65y) EXAM: CT HEAD WITHOUT CONTRAST CT MAXILLOFACIAL WITHOUT CONTRAST CT CERVICAL SPINE WITHOUT CONTRAST TECHNIQUE: Multidetector CT imaging of the head, cervical spine, and maxillofacial structures were performed using the standard protocol without intravenous contrast. Multiplanar CT image reconstructions of the cervical spine and maxillofacial structures were also generated. RADIATION DOSE REDUCTION: This exam was performed according to the departmental dose-optimization program which includes automated exposure control, adjustment of the mA and/or kV according to patient size and/or use of iterative reconstruction technique. COMPARISON:  None. FINDINGS: CT HEAD FINDINGS  BRAIN: BRAIN Patchy and confluent areas of decreased attenuation are noted throughout the deep and periventricular white matter of the cerebral hemispheres bilaterally, compatible with chronic microvascular ischemic disease. No evidence of large-territorial acute infarction. No parenchymal hemorrhage. No mass lesion. No extra-axial collection. No mass effect or midline shift. No hydrocephalus. Basilar cisterns are patent. Vascular: No hyperdense vessel. Skull: No acute fracture or focal lesion. Other: Mild left frontal scalp edema.  No large hematoma formation. CT MAXILLOFACIAL FINDINGS Osseous: No fracture or mandibular dislocation. No destructive process. Sinuses/Orbits: Paranasal sinuses and mastoid air cells are clear. Bilateral lens replacement. Otherwise orbits are unremarkable. Soft tissues: Negative. CT CERVICAL SPINE FINDINGS Alignment: Normal. Skull base and vertebrae: Multilevel severe degenerative changes of the spine. No acute fracture. No aggressive appearing focal osseous lesion or focal pathologic process. Soft tissues and spinal canal: No prevertebral fluid or swelling. No visible canal hematoma. Upper chest: Unremarkable. Other: Atherosclerotic plaque of the carotid arteries within the neck. IMPRESSION: 1. No acute intracranial abnormality. 2. No acute displaced facial fracture. 3. No acute displaced fracture or traumatic listhesis of the cervical spine. Electronically Signed   By: Iven Finn M.D.   On: 03/16/2021 20:40   DG Hand 2 View Right  Result Date: 03/16/2021 CLINICAL DATA:  Fall. EXAM: RIGHT HAND - 2 VIEW COMPARISON:  None. FINDINGS: There is no evidence for acute fracture or dislocation. There are degenerative changes of interphalangeal joints diffusely, severe at the second and third distal interphalangeal joints. There also moderate degenerative changes of the first carpometacarpal joint with joint space narrowing and osteophyte formation. Soft tissues are within normal limits.  IMPRESSION: 1. No acute bony abnormality. 2. Degenerative changes. Pattern of distribution suggest osteoarthritis. Electronically Signed   By: Ronney Asters M.D.   On: 03/16/2021 21:42   CT Maxillofacial WO CM  Result Date: 03/16/2021 CLINICAL DATA:  Head trauma, minor (Age >= 65y) Anticoagulated; Facial trauma, blunt; Neck trauma (Age >= 65y) EXAM: CT HEAD WITHOUT CONTRAST CT MAXILLOFACIAL WITHOUT CONTRAST CT CERVICAL SPINE WITHOUT CONTRAST TECHNIQUE: Multidetector CT imaging of the head, cervical spine, and maxillofacial structures were performed using the standard protocol without intravenous contrast. Multiplanar CT image reconstructions of the cervical spine and maxillofacial structures were also generated. RADIATION DOSE REDUCTION: This exam was performed according to the departmental dose-optimization program which includes automated exposure control, adjustment of the mA and/or kV according to patient size and/or use of iterative reconstruction technique. COMPARISON:  None. FINDINGS: CT HEAD FINDINGS BRAIN: BRAIN Patchy and confluent areas of decreased attenuation are noted throughout the deep and periventricular white matter of the cerebral hemispheres bilaterally, compatible with  chronic microvascular ischemic disease. No evidence of large-territorial acute infarction. No parenchymal hemorrhage. No mass lesion. No extra-axial collection. No mass effect or midline shift. No hydrocephalus. Basilar cisterns are patent. Vascular: No hyperdense vessel. Skull: No acute fracture or focal lesion. Other: Mild left frontal scalp edema.  No large hematoma formation. CT MAXILLOFACIAL FINDINGS Osseous: No fracture or mandibular dislocation. No destructive process. Sinuses/Orbits: Paranasal sinuses and mastoid air cells are clear. Bilateral lens replacement. Otherwise orbits are unremarkable. Soft tissues: Negative. CT CERVICAL SPINE FINDINGS Alignment: Normal. Skull base and vertebrae: Multilevel severe  degenerative changes of the spine. No acute fracture. No aggressive appearing focal osseous lesion or focal pathologic process. Soft tissues and spinal canal: No prevertebral fluid or swelling. No visible canal hematoma. Upper chest: Unremarkable. Other: Atherosclerotic plaque of the carotid arteries within the neck. IMPRESSION: 1. No acute intracranial abnormality. 2. No acute displaced facial fracture. 3. No acute displaced fracture or traumatic listhesis of the cervical spine. Electronically Signed   By: Iven Finn M.D.   On: 03/16/2021 20:40    Procedures Procedures   Procedures .Marland KitchenLaceration Repair   Date/Time: 03/16/2021 10:27 PM Performed by: Valarie Merino, MD Authorized by: Valarie Merino, MD    Consent:    Consent obtained:  Verbal   Consent given by:  Patient   Risks, benefits, and alternatives were discussed: yes     Risks discussed:  Infection, need for additional repair, nerve damage, poor wound healing, poor cosmetic result, pain, retained foreign body, tendon damage and vascular damage   Alternatives discussed:  No treatment Universal protocol:    Immediately prior to procedure, a time out was called: yes     Patient identity confirmed:  Verbally with patient Anesthesia:    Anesthesia method:  Local infiltration   Local anesthetic:  Lidocaine 2% WITH epi Laceration details:    Location: Forehead. Pre-procedure details:    Preparation:  Patient was prepped and draped in usual sterile fashion and imaging obtained to evaluate for foreign bodies Exploration:    Limited defect created (wound extended): no     Hemostasis achieved with:  Direct pressure   Imaging outcome: foreign body not noted     Wound exploration: wound explored through full range of motion     Contaminated: no   Treatment:    Area cleansed with:  Povidone-iodine and saline   Amount of cleaning:  Standard   Irrigation solution:  Sterile saline   Visualized foreign bodies/material removed: no      Debridement:  None   Undermining:  None Skin repair:    Repair method:  Sutures   Suture size:  5-0   Wound skin closure material used: vicryl.   Suture technique:  Simple interrupted   Number of sutures:  3  Medications Ordered in ED Medications  acetaminophen (TYLENOL) tablet 1,000 mg (1,000 mg Oral Given 03/16/21 2116)  lidocaine-EPINEPHrine (XYLOCAINE W/EPI) 2 %-1:200000 (PF) injection 10 mL (10 mLs Infiltration Given by Other 03/16/21 2119)  Tdap (BOOSTRIX) injection 0.5 mL (0.5 mLs Intramuscular Given 03/16/21 2117)    ED Course/ Medical Decision Making/ A&P                           Medical Decision Making Amount and/or Complexity of Data Reviewed Radiology: ordered.  Risk OTC drugs. Prescription drug management.    Medical Screen Complete  This patient presented to the ED with complaint of fall, head injury, forehead laceration.  This complaint involves an extensive number of treatment options. The initial differential diagnosis includes, but is not limited to, traumatic injury, intracranial current injury, cervical spine traumatic injury, laceration, etc.  This presentation is: Acute, Previously Undiagnosed, Uncertain Prognosis, Complicated, Systemic Symptoms, and Threat to Life/Bodily Function  Patient presents after fall from standing after she tripped on a curb.  She has a forehead laceration that required repair.  Imaging does not reveal evidence of significant traumatic injury.  Patient is comfortable at time of discharge.  She does understand need for close follow-up.  Strict return precautions given and understood.   Co morbidities that complicated the patient's evaluation  Advanced age, on Eliquis   Additional history obtained:  Additional history obtained from Spouse External records from outside sources obtained and reviewed including prior ED visits and prior Inpatient records.     Imaging Studies ordered:  I ordered imaging studies  including CT head, CT maxillofacial, CT cervical spine, plain films of the right hand and right knee I independently visualized and interpreted obtained imaging which showed no acute traumatic injury I agree with the radiologist interpretation.   Cardiac Monitoring:  The patient was maintained on a cardiac monitor.  I personally viewed and interpreted the cardiac monitor which showed an underlying rhythm of: NSR   Medicines ordered:  I ordered medication including Tylenol for pain Reevaluation of the patient after these medicines showed that the patient: improved      Problem List / ED Course:  Fall, head injury, forehead laceration requiring repair   Reevaluation:  After the interventions noted above, I reevaluated the patient and found that they have: improved   Disposition:  After consideration of the diagnostic results and the patients response to treatment, I feel that the patent would benefit from close outpatient follow-up.   CRITICAL CARE Performed by: Valarie Merino   Total critical care time: 30 minutes  Critical care time was exclusive of separately billable procedures and treating other patients.  Critical care was necessary to treat or prevent imminent or life-threatening deterioration.  Critical care was time spent personally by me on the following activities: development of treatment plan with patient and/or surrogate as well as nursing, discussions with consultants, evaluation of patient's response to treatment, examination of patient, obtaining history from patient or surrogate, ordering and performing treatments and interventions, ordering and review of laboratory studies, ordering and review of radiographic studies, pulse oximetry and re-evaluation of patient's condition.         Final Clinical Impression(s) / ED Diagnoses Final diagnoses:  Injury of head, initial encounter  Laceration of forehead, initial encounter  Abrasion of face,  initial encounter    Rx / DC Orders ED Discharge Orders          Ordered    HYDROcodone-acetaminophen (NORCO/VICODIN) 5-325 MG tablet  Every 4 hours PRN        03/16/21 2224              Valarie Merino, MD 03/16/21 2239

## 2021-03-16 NOTE — ED Provider Triage Note (Signed)
Emergency Medicine Provider Triage Evaluation Note  Elizabeth Barnett , a 81 y.o. female  was evaluated in triage.  Pt complains of fall.  She was walking to see like cats and tripped on the curb striking her face.  She takes Eliquis.  She reports compliance with her Eliquis.  She thinks she also may have injured her knee and broken her nose.  She states she currently just feels weak and unwell.    Physical Exam  There were no vitals taken for this visit. Gen:   Awake, slightly tremulous Resp:  Normal effort  MSK:   Moves extremities without difficulty  Other:  Obvious wound on the forehead with swelling and wound on the nose.  Medical Decision Making  Medically screening exam initiated at 8:13 PM.  Appropriate orders placed.  KLYN CHRISTAKOS was informed that the remainder of the evaluation will be completed by another provider, this initial triage assessment does not replace that evaluation, and the importance of remaining in the ED until their evaluation is complete.  Patient fell, has obvious head trauma and is anticoagulated with Eliquis. CT head, max face, neck ordered. Patient was taken immediately to [place] [facility], charge RN notified, she will be taken to [facility] after.   Lorin Glass, [place] 03/16/21 2018

## 2021-06-11 ENCOUNTER — Ambulatory Visit: Payer: Medicare Other | Admitting: Rheumatology

## 2021-06-21 ENCOUNTER — Other Ambulatory Visit: Payer: Self-pay | Admitting: Orthopaedic Surgery

## 2021-06-21 DIAGNOSIS — M25511 Pain in right shoulder: Secondary | ICD-10-CM

## 2021-06-27 ENCOUNTER — Other Ambulatory Visit (HOSPITAL_COMMUNITY): Payer: Self-pay | Admitting: Orthopaedic Surgery

## 2021-06-27 DIAGNOSIS — M25511 Pain in right shoulder: Secondary | ICD-10-CM

## 2021-07-02 ENCOUNTER — Ambulatory Visit: Payer: Medicare Other | Admitting: Rheumatology

## 2021-07-25 ENCOUNTER — Ambulatory Visit (HOSPITAL_COMMUNITY)
Admission: RE | Admit: 2021-07-25 | Discharge: 2021-07-25 | Disposition: A | Discharge status: Home / Self Care | Payer: Medicare Other | Source: Ambulatory Visit | Attending: Orthopaedic Surgery | Admitting: Orthopaedic Surgery

## 2021-07-25 DIAGNOSIS — M25511 Pain in right shoulder: Secondary | ICD-10-CM | POA: Insufficient documentation

## 2021-07-25 IMAGING — MR MR SHOULDER*R* W/O CM
4 of 6 series · 19 of 40 positions shown · non-contrast
Comparison: None Available.

CLINICAL DATA: Chronic right shoulder weakness.

EXAM:
MRI OF THE RIGHT SHOULDER WITHOUT CONTRAST
TECHNIQUE: Multiplanar, multisequence MR imaging of the shoulder was performed.
No intravenous contrast was administered.

[Series 5: PD fat-sat · sagittal · 4.0mm · 0.27mm/px · 6 of 15 slices shown]
[im 1/15]
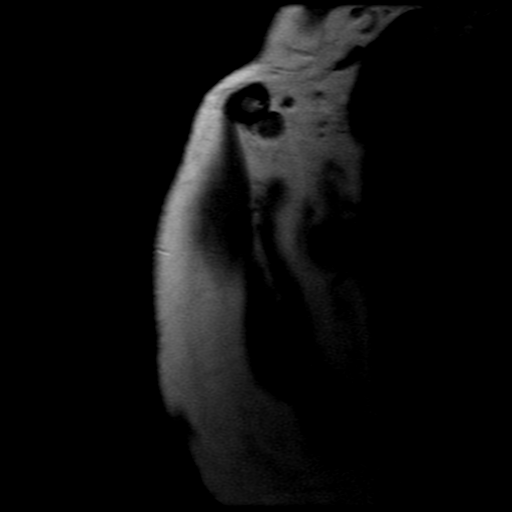
[im 3/15]
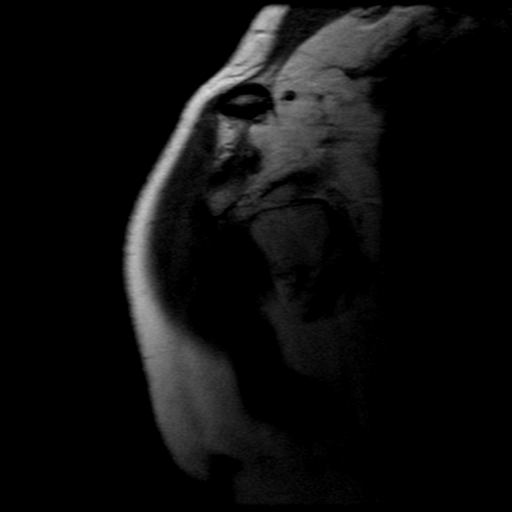
[im 6/15]
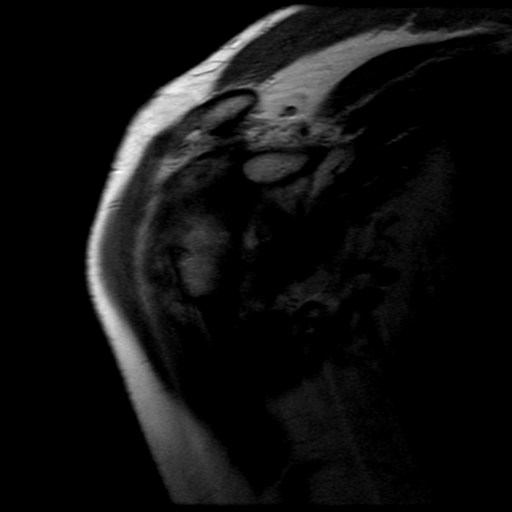
[im 9/15]
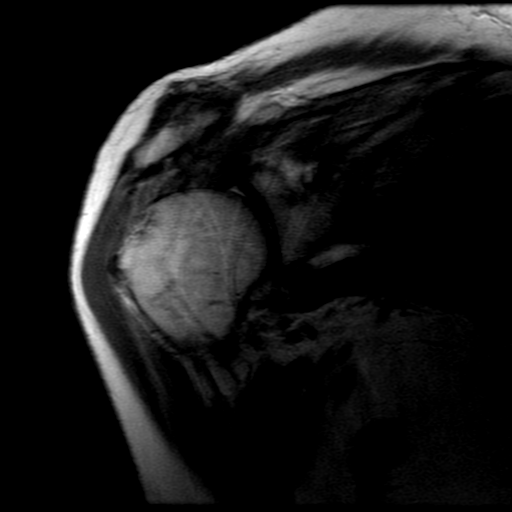
[im 12/15]
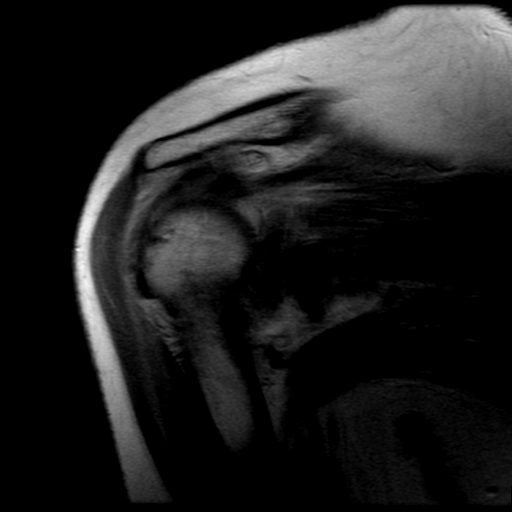
[im 15/15]
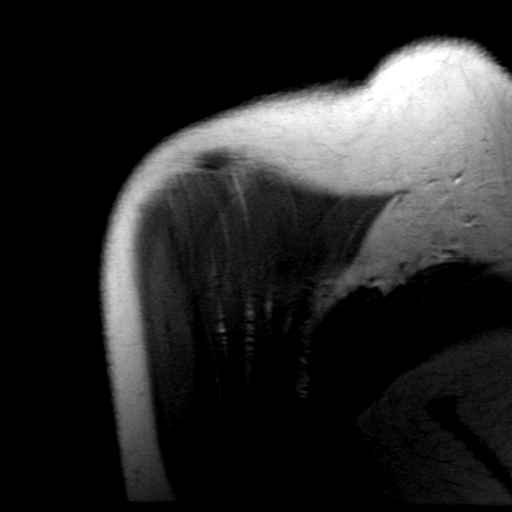

[Series 6: T2 fat-sat · coronal · 4.0mm · 0.27mm/px · 4 of 16 slices shown (1 of 2)]
[im 1/16]
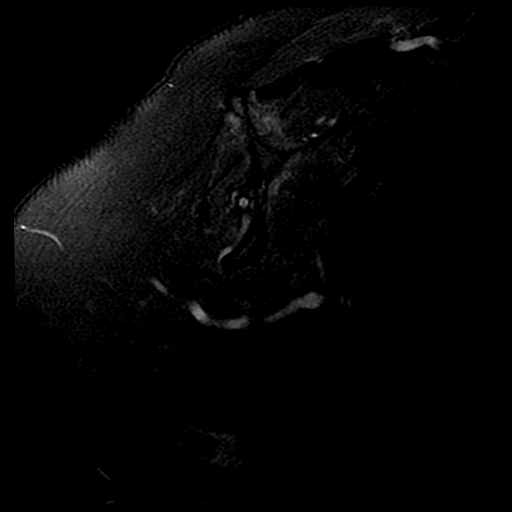
[im 3/16]
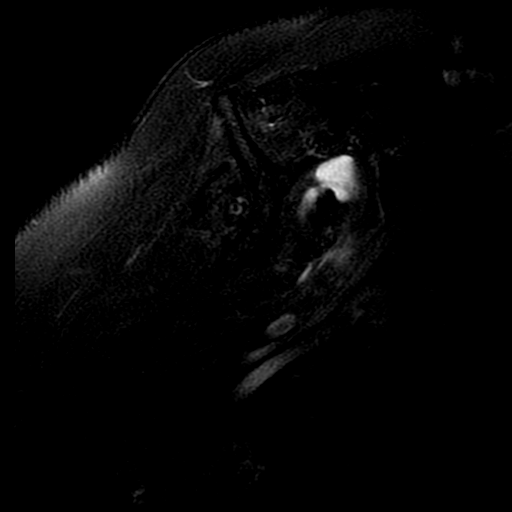
[im 8/16]
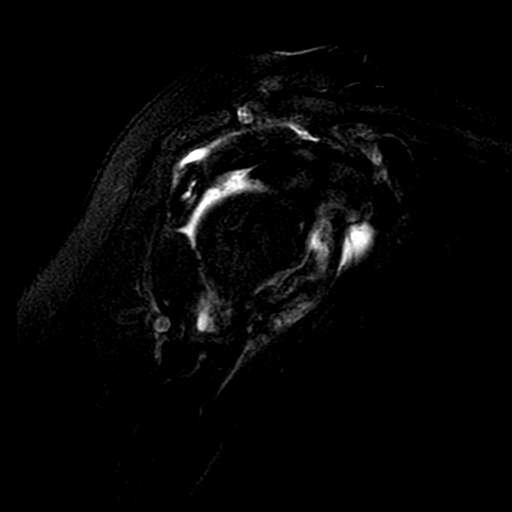
[im 13/16]
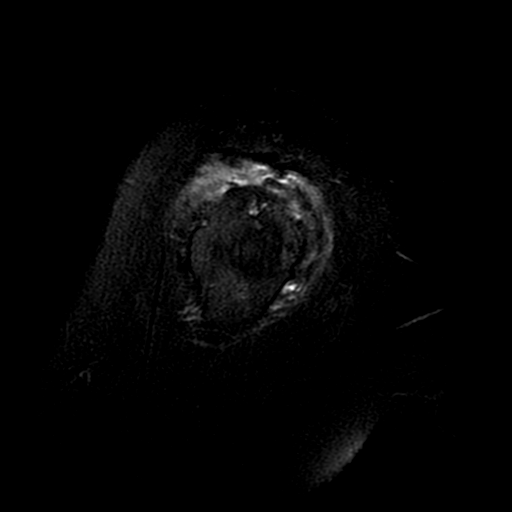

[Series 8: T2 fat-sat · axial · 4.0mm · 0.23mm/px · z∈[-36,+34]mm · 3 of 20 slices shown (2 of 2)]
[im 3/20]
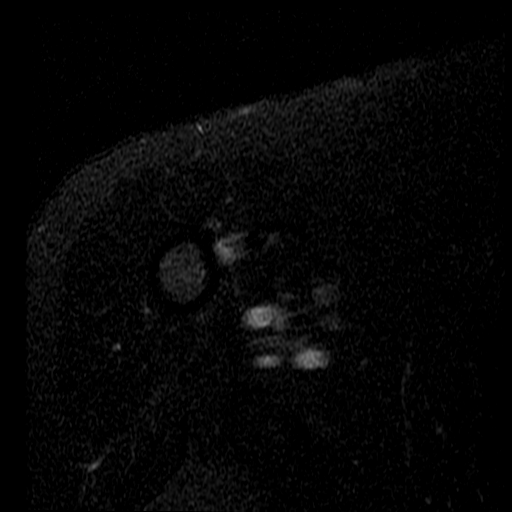
[im 11/20]
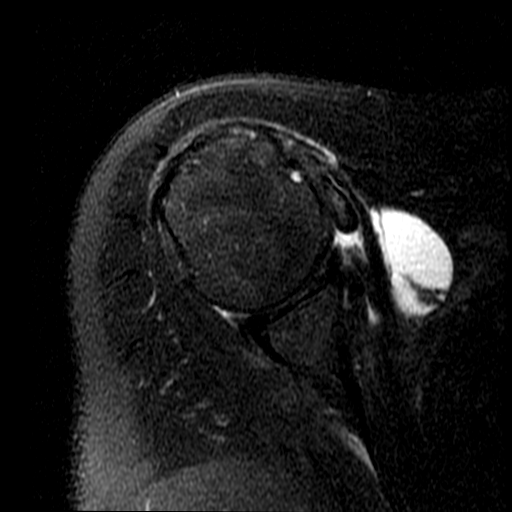
[im 17/20]
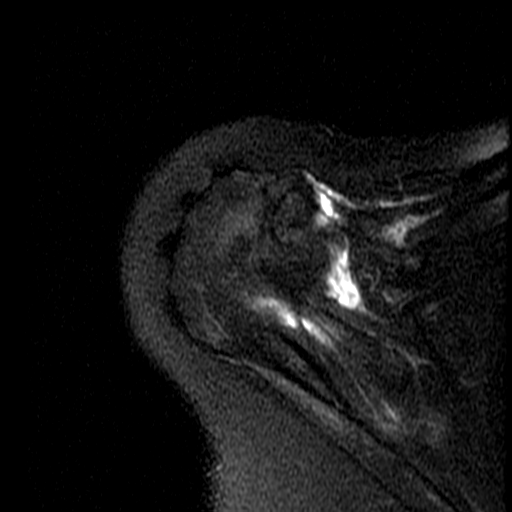

[Series 10: PD · sagittal · 4.0mm · 0.29mm/px · 6 of 15 slices shown]
[im 1/15]
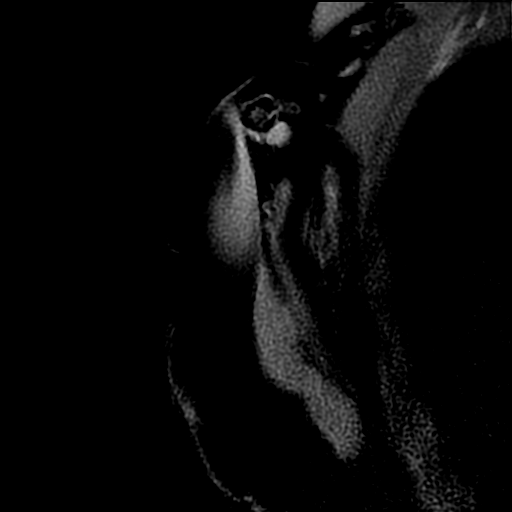
[im 3/15]
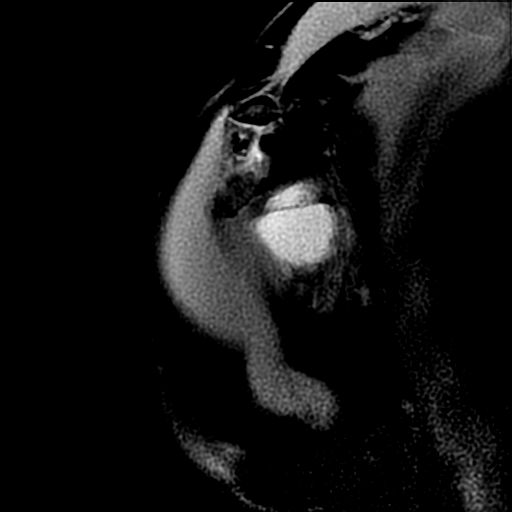
[im 6/15]
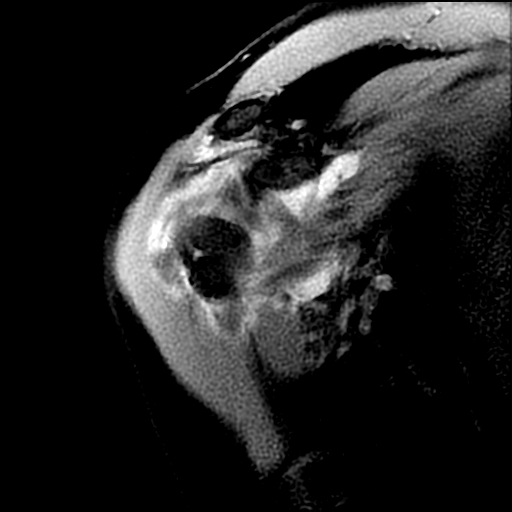
[im 9/15]
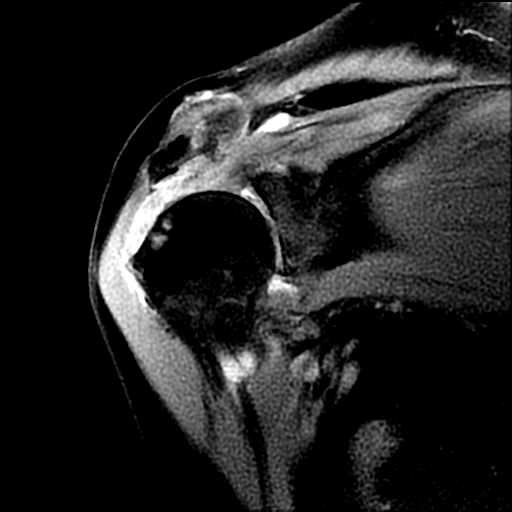
[im 12/15]
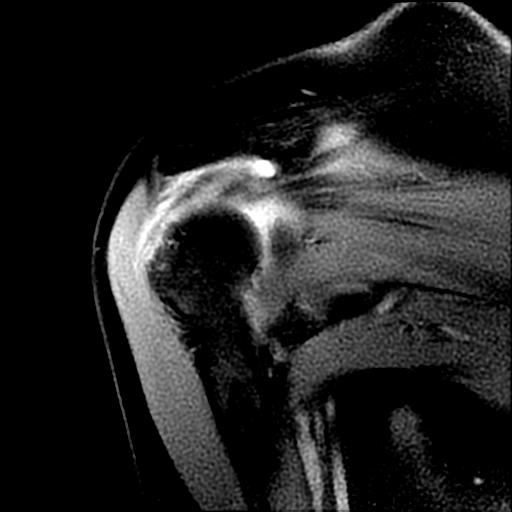
[im 15/15]
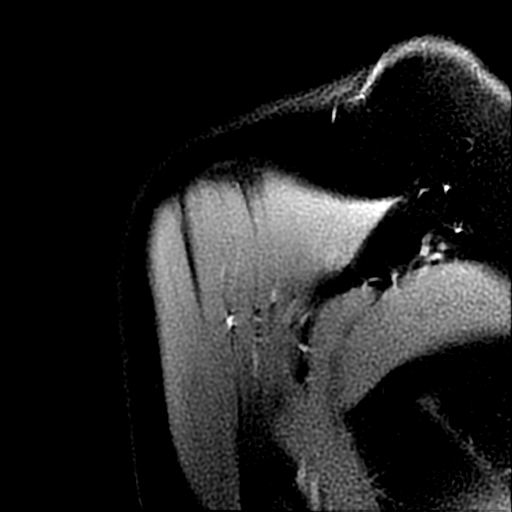

[19 of 40 positions shown; findings below may reference images not displayed]

FINDINGS: Rotator cuff: Severe tendinosis of the supraspinatus tendon with a
complete tear and 2 cm of retraction. Moderate tendinosis of the
infraspinatus tendon. High-grade partial-thickness tear of the
subscapularis tendon. Teres minor tendon is intact.

Muscles: No muscle atrophy or edema. No intramuscular fluid
collection or hematoma.

Biceps Long Head: Severe tendinosis of the intra-articular portion
of the long head of the biceps tendon. Medial subluxation of the
proximal extra-articular portion of the biceps tendon from the
bicipital groove.

Acromioclavicular Joint: Severe arthropathy of the acromioclavicular
joint. Trace subacromial/subdeltoid bursal fluid.

Glenohumeral Joint: Moderate joint effusion. Partial-thickness
cartilage loss of the glenohumeral joint.

Labrum: Grossly intact, but evaluation is limited by lack of
intraarticular fluid/contrast.

Bones: No fracture or dislocation. No aggressive osseous lesion.
Mild subcortical reactive marrow changes at the supraspinatus
insertion.

Other: No fluid collection or hematoma.
IMPRESSION: 1. Severe tendinosis of the supraspinatus tendon with a complete
tear and 2 cm of retraction.
2. Moderate tendinosis of the infraspinatus tendon.
3. High-grade partial-thickness tear of the subscapularis tendon.
4. Severe tendinosis of the intra-articular portion of the long head
of the biceps tendon. Medial subluxation of the proximal
extra-articular portion of the biceps tendon from the bicipital
groove.
5. Mild-moderate osteoarthritis of the glenohumeral joint.

## 2021-07-25 NOTE — Progress Notes (Signed)
Patient here today at [facility] for MRI right shoulder wo contrast. Patient has medtronic device. CLE sent. Orders received for DOO 85. Will re-program once scan is completed.

## 2021-08-03 ENCOUNTER — Inpatient Hospital Stay (HOSPITAL_BASED_OUTPATIENT_CLINIC_OR_DEPARTMENT_OTHER)
Admission: EM | Admit: 2021-08-03 | Discharge: 2021-08-05 | DRG: 640 | Disposition: A | Discharge status: Home / Self Care | Payer: Medicare Other | Attending: Internal Medicine | Admitting: Internal Medicine

## 2021-08-03 ENCOUNTER — Other Ambulatory Visit: Payer: Self-pay

## 2021-08-03 ENCOUNTER — Encounter (HOSPITAL_BASED_OUTPATIENT_CLINIC_OR_DEPARTMENT_OTHER): Payer: Self-pay

## 2021-08-03 DIAGNOSIS — Z79899 Other long term (current) drug therapy: Secondary | ICD-10-CM | POA: Diagnosis not present

## 2021-08-03 DIAGNOSIS — K219 Gastro-esophageal reflux disease without esophagitis: Secondary | ICD-10-CM | POA: Diagnosis present

## 2021-08-03 DIAGNOSIS — I495 Sick sinus syndrome: Secondary | ICD-10-CM | POA: Diagnosis present

## 2021-08-03 DIAGNOSIS — Z881 Allergy status to other antibiotic agents status: Secondary | ICD-10-CM | POA: Diagnosis not present

## 2021-08-03 DIAGNOSIS — E860 Dehydration: Secondary | ICD-10-CM | POA: Diagnosis not present

## 2021-08-03 DIAGNOSIS — Z91041 Radiographic dye allergy status: Secondary | ICD-10-CM

## 2021-08-03 DIAGNOSIS — E876 Hypokalemia: Secondary | ICD-10-CM | POA: Diagnosis present

## 2021-08-03 DIAGNOSIS — U071 COVID-19: Secondary | ICD-10-CM | POA: Diagnosis not present

## 2021-08-03 DIAGNOSIS — I10 Essential (primary) hypertension: Secondary | ICD-10-CM | POA: Diagnosis present

## 2021-08-03 DIAGNOSIS — Z886 Allergy status to analgesic agent status: Secondary | ICD-10-CM

## 2021-08-03 DIAGNOSIS — Z91030 Bee allergy status: Secondary | ICD-10-CM

## 2021-08-03 DIAGNOSIS — Z8249 Family history of ischemic heart disease and other diseases of the circulatory system: Secondary | ICD-10-CM | POA: Diagnosis not present

## 2021-08-03 DIAGNOSIS — E871 Hypo-osmolality and hyponatremia: Secondary | ICD-10-CM | POA: Diagnosis present

## 2021-08-03 DIAGNOSIS — R300 Dysuria: Secondary | ICD-10-CM | POA: Diagnosis not present

## 2021-08-03 DIAGNOSIS — K5909 Other constipation: Secondary | ICD-10-CM | POA: Diagnosis present

## 2021-08-03 DIAGNOSIS — Z950 Presence of cardiac pacemaker: Secondary | ICD-10-CM | POA: Diagnosis not present

## 2021-08-03 DIAGNOSIS — Z888 Allergy status to other drugs, medicaments and biological substances status: Secondary | ICD-10-CM | POA: Diagnosis not present

## 2021-08-03 DIAGNOSIS — E785 Hyperlipidemia, unspecified: Secondary | ICD-10-CM | POA: Diagnosis not present

## 2021-08-03 LAB — COMPREHENSIVE METABOLIC PANEL
ALT: 22 U/L (ref 0–44)
AST: 31 U/L (ref 15–41)
Albumin: 4 g/dL (ref 3.5–5.0)
Alkaline Phosphatase: 50 U/L (ref 38–126)
Anion gap: 10 (ref 5–15)
BUN: 9 mg/dL (ref 8–23)
CO2: 20 mmol/L — ABNORMAL LOW (ref 22–32)
Calcium: 8.3 mg/dL — ABNORMAL LOW (ref 8.9–10.3)
Chloride: 87 mmol/L — ABNORMAL LOW (ref 98–111)
Creatinine, Ser: 0.67 mg/dL (ref 0.44–1.00)
GFR, Estimated: >60 mL/min (ref >60)
Glucose, Bld: 140 mg/dL — ABNORMAL HIGH (ref 70–99)
Potassium: 2.6 mmol/L — CL (ref 3.5–5.1)
Sodium: 117 mmol/L — CL (ref 135–145)
Total Bilirubin: 1.2 mg/dL (ref 0.3–1.2)
Total Protein: 6.8 g/dL (ref 6.5–8.1)

## 2021-08-03 LAB — URINALYSIS, MICROSCOPIC (REFLEX)
RBC / HPF: NONE SEEN RBC/hpf (ref 0–5)
Squamous Epithelial / HPF: NONE SEEN (ref 0–5)
WBC, UA: NONE SEEN WBC/hpf (ref 0–5)

## 2021-08-03 LAB — URINALYSIS, ROUTINE W REFLEX MICROSCOPIC
Bilirubin Urine: NEGATIVE
Glucose, UA: NEGATIVE mg/dL
Ketones, ur: NEGATIVE mg/dL
Leukocytes,Ua: NEGATIVE
Nitrite: NEGATIVE
Protein, ur: NEGATIVE mg/dL
Specific Gravity, Urine: 1.015 (ref 1.005–1.030)
pH: 7.5 (ref 5.0–8.0)

## 2021-08-03 LAB — CBC WITH DIFFERENTIAL/PLATELET
Abs Immature Granulocytes: 0.02 10*3/uL (ref 0.00–0.07)
Basophils Absolute: 0 10*3/uL (ref 0.0–0.1)
Basophils Relative: 0 %
Eosinophils Absolute: 0 10*3/uL (ref 0.0–0.5)
Eosinophils Relative: 1 %
HCT: 35.4 % — ABNORMAL LOW (ref 36.0–46.0)
Hemoglobin: 12.8 g/dL (ref 12.0–15.0)
Immature Granulocytes: 0 %
Lymphocytes Relative: 12 %
Lymphs Abs: 0.7 10*3/uL (ref 0.7–4.0)
MCH: 28.4 pg (ref 26.0–34.0)
MCHC: 36.2 g/dL — ABNORMAL HIGH (ref 30.0–36.0)
MCV: 78.7 fL — ABNORMAL LOW (ref 80.0–100.0)
Monocytes Absolute: 0.6 10*3/uL (ref 0.1–1.0)
Monocytes Relative: 10 %
Neutro Abs: 4.5 10*3/uL (ref 1.7–7.7)
Neutrophils Relative %: 77 %
Platelets: 261 10*3/uL (ref 150–400)
RBC: 4.5 MIL/uL (ref 3.87–5.11)
RDW: 13.2 % (ref 11.5–15.5)
WBC: 5.9 10*3/uL (ref 4.0–10.5)
nRBC: 0 % (ref 0.0–0.2)

## 2021-08-03 LAB — MAGNESIUM: Magnesium: 1.9 mg/dL (ref 1.7–2.4)

## 2021-08-03 LAB — OSMOLALITY: Osmolality: 244 mOsm/kg — CL (ref 275–295)

## 2021-08-03 LAB — OSMOLALITY, URINE: Osmolality, Ur: 391 mOsm/kg (ref 300–900)

## 2021-08-03 LAB — PHOSPHORUS: Phosphorus: 2.1 mg/dL — ABNORMAL LOW (ref 2.5–4.6)

## 2021-08-03 LAB — SARS CORONAVIRUS 2 BY RT PCR: SARS Coronavirus 2 by RT PCR: POSITIVE — AB

## 2021-08-03 LAB — SODIUM, URINE, RANDOM: Sodium, Ur: 69 mmol/L

## 2021-08-03 MED ORDER — VITAMIN D 25 MCG (1000 UNIT) PO TABS
1000.0000 [IU] | ORAL_TABLET | Freq: Every day | ORAL | Status: DC
Start: 2021-08-04 — End: 2021-08-05
  Administered 2021-08-04 – 2021-08-05 (×2): 1000 [IU] via ORAL
  Filled 2021-08-03 (×2): qty 1

## 2021-08-03 MED ORDER — SODIUM CHLORIDE 0.9 % IV SOLN: Freq: Once | INTRAVENOUS | Status: AC

## 2021-08-03 MED ORDER — CYCLOSPORINE 0.05 % OP EMUL
1.0000 [drp] | Freq: Two times a day (BID) | OPHTHALMIC | Status: DC
  Administered 2021-08-03 – 2021-08-05 (×4): 1 [drp] via OPHTHALMIC
  Filled 2021-08-03 (×5): qty 30

## 2021-08-03 MED ORDER — LACTATED RINGERS IV BOLUS
1000.0000 mL | Freq: Once | INTRAVENOUS | Status: AC
  Administered 2021-08-03: 1000 mL via INTRAVENOUS

## 2021-08-03 MED ORDER — POTASSIUM CHLORIDE CRYS ER 20 MEQ PO TBCR
40.0000 meq | EXTENDED_RELEASE_TABLET | ORAL | Status: AC
  Administered 2021-08-03 – 2021-08-04 (×2): 40 meq via ORAL
  Filled 2021-08-03 (×2): qty 2

## 2021-08-03 MED ORDER — SODIUM CHLORIDE 0.9 % IV SOLN
INTRAVENOUS | Status: DC
Start: 2021-08-03 — End: 2021-08-04

## 2021-08-03 MED ORDER — ONDANSETRON HCL 4 MG/2ML IJ SOLN
4.0000 mg | Freq: Once | INTRAMUSCULAR | Status: AC
Start: 2021-08-03 — End: 2021-08-03
  Administered 2021-08-03: 4 mg via INTRAVENOUS
  Filled 2021-08-03: qty 2

## 2021-08-03 MED ORDER — POLYETHYLENE GLYCOL 3350 17 G PO PACK
17.0000 g | PACK | Freq: Every day | ORAL | Status: DC
Start: 2021-08-03 — End: 2021-08-04
  Administered 2021-08-03 – 2021-08-04 (×2): 17 g via ORAL
  Filled 2021-08-03 (×2): qty 1

## 2021-08-03 MED ORDER — ENOXAPARIN SODIUM 40 MG/0.4ML IJ SOSY
40.0000 mg | PREFILLED_SYRINGE | INTRAMUSCULAR | Status: DC
Start: 2021-08-03 — End: 2021-08-03
  Administered 2021-08-03: 40 mg via SUBCUTANEOUS
  Filled 2021-08-03: qty 0.4

## 2021-08-03 MED ORDER — VITAMIN B-12 1000 MCG PO TABS
1000.0000 ug | ORAL_TABLET | Freq: Every day | ORAL | Status: DC
Start: 2021-08-04 — End: 2021-08-05
  Administered 2021-08-04 – 2021-08-05 (×2): 1000 ug via ORAL
  Filled 2021-08-03 (×2): qty 1

## 2021-08-03 MED ORDER — POTASSIUM CHLORIDE IN NACL 20-0.9 MEQ/L-% IV SOLN
INTRAVENOUS | Status: DC
  Filled 2021-08-03: qty 1000

## 2021-08-03 MED ORDER — ACETAMINOPHEN 325 MG PO TABS: 650.0000 mg | ORAL_TABLET | Freq: Four times a day (QID) | ORAL | Status: DC | PRN

## 2021-08-03 MED ORDER — POTASSIUM CHLORIDE 10 MEQ/100ML IV SOLN
10.0000 meq | INTRAVENOUS | Status: AC
  Administered 2021-08-03 (×2): 10 meq via INTRAVENOUS
  Filled 2021-08-03 (×2): qty 100

## 2021-08-03 MED ORDER — ATORVASTATIN CALCIUM 20 MG PO TABS
20.0000 mg | ORAL_TABLET | Freq: Every day | ORAL | Status: DC
  Administered 2021-08-03 – 2021-08-04 (×2): 20 mg via ORAL
  Filled 2021-08-03 (×2): qty 1

## 2021-08-03 MED ORDER — FLECAINIDE ACETATE 50 MG PO TABS
50.0000 mg | ORAL_TABLET | Freq: Two times a day (BID) | ORAL | Status: DC
  Administered 2021-08-03 – 2021-08-05 (×4): 50 mg via ORAL
  Filled 2021-08-03 (×5): qty 1

## 2021-08-03 MED ORDER — AMLODIPINE BESYLATE 5 MG PO TABS
5.0000 mg | ORAL_TABLET | Freq: Every day | ORAL | Status: DC
Start: 2021-08-03 — End: 2021-08-05
  Administered 2021-08-03 – 2021-08-05 (×3): 5 mg via ORAL
  Filled 2021-08-03 (×3): qty 1

## 2021-08-03 MED ORDER — POTASSIUM CHLORIDE 10 MEQ/100ML IV SOLN
INTRAVENOUS | Status: AC
  Administered 2021-08-03: 10 meq via INTRAVENOUS
  Filled 2021-08-03: qty 100

## 2021-08-03 MED ORDER — APIXABAN 2.5 MG PO TABS
2.5000 mg | ORAL_TABLET | Freq: Two times a day (BID) | ORAL | Status: DC
  Administered 2021-08-03 – 2021-08-05 (×4): 2.5 mg via ORAL
  Filled 2021-08-03 (×4): qty 1

## 2021-08-03 MED ORDER — POTASSIUM CHLORIDE CRYS ER 20 MEQ PO TBCR
40.0000 meq | EXTENDED_RELEASE_TABLET | Freq: Once | ORAL | Status: AC
  Administered 2021-08-03: 40 meq via ORAL
  Filled 2021-08-03: qty 2

## 2021-08-03 MED ORDER — MAGNESIUM SULFATE 2 GM/50ML IV SOLN
2.0000 g | Freq: Once | INTRAVENOUS | Status: AC
  Administered 2021-08-03: 2 g via INTRAVENOUS
  Filled 2021-08-03: qty 50

## 2021-08-03 MED ORDER — VITAMIN D3 25 MCG (1000 UT) PO CAPS
1000.0000 [IU] | ORAL_CAPSULE | Freq: Every morning | ORAL | Status: DC
Start: 2021-08-04 — End: 2021-08-03

## 2021-08-03 MED ORDER — ACETAMINOPHEN 650 MG RE SUPP: 650.0000 mg | Freq: Four times a day (QID) | RECTAL | Status: DC | PRN

## 2021-08-03 NOTE — ED Notes (Signed)
Urine culture sent to lab with urine sample.  

## 2021-08-03 NOTE — ED Notes (Signed)
Pts spouse, Messina Kosinski, requests call at  [phone] when room at [facility] has been assigned.

## 2021-08-03 NOTE — ED Triage Notes (Signed)
Patient c/o burning with urination since last night. Patient states she is on 6th day of dx with covid. Endorses nausea

## 2021-08-03 NOTE — H&P (Signed)
History and Physical    Patient: Elizabeth Barnett DOB: Mar 20, 1940 DOA: 08/03/2021 DOS: the patient was seen and examined on 08/03/2021 PCP: Cheron Schaumann., MD  Patient coming from: Home  Chief Complaint: weakness Chief Complaint  Patient presents with   Dysuria   HPI: Elizabeth Barnett is a 81 y.o. female with medical history significant of sick sinus syndrome with pacemaker, hypertension, recent diagnosis of COVID who presents to the emergency department initially with complaints of burning with urination.  Patient was later found to be hyponatremic with sodium of 117..  Of note, patient was diagnosed with COVID 6 days prior to this visit.  Patient was given molnupiravir but did not continue past 2 days because of side effects.  Patient subsequently did not eat or drink very well.  Also had nausea without vomiting.  Patient denies shortness of breath, cough.  Patient's only symptom of COVID is runny nose.  No known sick contacts  Review of Systems: As mentioned in the history of present illness. All other systems reviewed and are negative. Past Medical History:  Diagnosis Date   Hypertension    Past Surgical History:  Procedure Laterality Date   ANAL RECTAL MANOMETRY N/A 09/11/2017   Procedure: ANO RECTAL MANOMETRY;  Surgeon: Willis Modena, MD;  Location: [facility];  Service: Endoscopy;  Laterality: N/A;  DR. Bosie Clos TO READ   Social History:  reports that she has never smoked. She has never used smokeless tobacco. She reports current alcohol use. She reports that she does not use drugs.  Allergies  Allergen Reactions   Bee Venom Anaphylaxis   Diclofenac Sodium Anaphylaxis and Hives   Doxycycline Hyclate Diarrhea   Iodinated Contrast Media Hives and Itching   Meloxicam Itching        Nsaids Anaphylaxis   Voltaren [Diclofenac Sodium]    Acyclovir Hives        Denosumab Hives and Other (See Comments)    PROLIA injection   Lorazepam     Neomycin-Polymyxin-Dexameth Other (See Comments)    EYE PAIN    Zoledronic Acid Other (See Comments)    RECLAST injection = Headache     Promethazine Other (See Comments)    Patient FAINTED     Family history: Father with heart disease  Prior to Admission medications   Medication Sig Start Date End Date Taking? Authorizing Provider  atorvastatin (LIPITOR) 20 MG tablet Take 20 mg by mouth daily.    [provider]  dexlansoprazole (DEXILANT) 60 MG capsule Take 60 mg by mouth daily.    [provider]  HYDROcodone-acetaminophen (NORCO/VICODIN) 5-325 MG tablet Take 2 tablets by mouth every 4 (four) hours as needed. 03/16/21   Wynetta Fines, MD  losartan-hydrochlorothiazide (HYZAAR) 100-25 MG per tablet Take 1 tablet by mouth daily.    [provider]  polyethylene glycol (MIRALAX / GLYCOLAX) packet Take 17 g by mouth daily.    [provider]  predniSONE (DELTASONE) 50 MG tablet Take 50mg  prednisone on 02/29/20 @ 9:20pm.  Take 50mg  prednisone on 03/01/20 @ 3:20am.  Take 50mg  prenisone on 03/01/20 @ 9:20am AND 50mg  benadryl. 02/27/20   , MD  sucralfate (CARAFATE) 1 G tablet Take 1 g by mouth 4 (four) times daily -  with meals and at bedtime.    [provider]    Physical Exam: Vitals:   08/03/21 1100 08/03/21 1200 08/03/21 1500 08/03/21 1607  BP: 138/62 (!) 147/67 (!) 143/61 140/60  Pulse: 60 60 62  65  Resp: 20 15 20 20   Temp:   98.7 F (37.1 C) 97.7 F (36.5 C)  TempSrc:   Oral Oral  SpO2: 99% 100% 100% 99%  Weight:      Height:    4\' 11"  (1.499 m)   General exam: Awake, laying in bed, in nad, mucous membranes dry Respiratory system: Normal respiratory effort, no wheezing Cardiovascular system: regular rate, s1, s2 Gastrointestinal system: Soft, nondistended, positive BS Central nervous system: CN2-12 grossly intact, strength intact Extremities: Perfused, no clubbing Skin: Normal skin turgor, no notable skin lesions  seen Psychiatry: Mood normal // no visual hallucinations   Data Reviewed:  Labs reviewed: Sodium 117, potassium 2.6, osmolality 244, hemoglobin 12.8, urine osmolality 391  Assessment and Plan: No notes have been filed under this hospital service. Service: Hospitalist  Hyponatremia -Presenting sodium of 117, baseline sodium in the low to mid 130s -Patient does appear dehydrated on exam with dry mucous membranes and poor skin turgor in the setting of poor p.o. intake -Would continue patient with aggressive IV fluid hydration as tolerated -Patient had previously been on Hyzaar for blood pressure.  Would hold for now  Hypokalemia -Presenting potassium 2.6 -50 mill equivalents of KCl already given in ED, will give additional 40 mill equivalents p.o. 4 hours x 2 more doses -Repeat basic metabolic panel in the morning  COVID -Patient largely asymptomatic with only mild runny nose as symptom -On room air -We will continue on vitamin C and zinc  Hypertension -Given hyponatremia, would continue to hold Hyzaar -We will continue patient on low-dose Norvasc for blood pressure instead  History of sick sinus syndrome -Patient is status post pacemaker placement -Heart rate appears stable at this time, continued on telemetry   Advance Care Planning:   Code Status: Full Code full code confirmed with patient with family at bedside  Consults:   Family Communication: Patient's husband at bedside  Severity of Illness: The appropriate patient status for this patient is OBSERVATION. Observation status is judged to be reasonable and necessary in order to provide the required intensity of service to ensure the patient's safety. The patient's presenting symptoms, physical exam findings, and initial radiographic and laboratory data in the context of their medical condition is felt to place them at decreased risk for further clinical deterioration. Furthermore, it is anticipated that the patient will be  medically stable for discharge from the hospital within 2 midnights of admission.   Author: , MD 08/03/2021 5:39 PM  For on call review [email or web address].

## 2021-08-03 NOTE — Progress Notes (Signed)
Plan of Care Note for accepted transfer   Patient: Elizabeth Barnett MRN: [id number]   DOA: 08/03/2021  Facility requesting transfer: [facility].  Requesting Provider: Alvira Monday, MD. Reason for transfer: Hyponatremia. Facility course:   Chief complaint: Dysuria Per Dr. Dalene Seltzer:  "HPI 81 year old female with a history of hypertension, sick sinus w/syndrome pacemaker who presents with fatigue, decreased appetite, dysuria and nausea after being diagnosed with COVID 6 days ago.    Initially with COVID had some fever, fatigue. She was given molnupiravir but did not continue to take it because she wasn't sure if she was having side effects.  She has not wanted to eat or drink much all week and feels dry.  She has felt the need to urinate frequently but feels she has not been able to empty her bladder and has had dysuria.  Has had constant nausea, but no vomiting. Has chronic constipation, describes flaky stool over last 2 days. No significant diarrhea. Denies abdominal pain, chest pain or dyspnea. No back pain/numbness/weakness.  Yesterday she thought she was getting over her COVID but today had runny nose again. No sinus pressure or ear pain."  Lab work:  Magnesium [[id number]]   Collected: 08/03/21 0816   Updated: 08/03/21 0953   Specimen Type: Blood    Magnesium 1.9 mg/dL  Sodium, urine, random [[id number]]   Collected: 08/03/21 0752   Updated: 08/03/21 0943   Specimen Type: Urine   Osmolality, urine [[id number]]   Collected: 08/03/21 0752   Updated: 08/03/21 0943   Specimen Type: Urine   Comprehensive metabolic panel [[id number]] (Abnormal)   Collected: 08/03/21 0816   Updated: 08/03/21 0859   Specimen Type: Blood   Specimen Source: Vein    Sodium 117 Low Panic   mmol/L   Potassium 2.6 Low Panic   mmol/L   Chloride 87 Low  mmol/L   CO2 20 Low  mmol/L   Glucose, Bld 140 High  mg/dL   BUN 9 mg/dL   Creatinine, Ser 3.71 mg/dL   Calcium 8.3 Low  mg/dL   Total  Protein 6.8 g/dL   Albumin 4.0 g/dL   AST 31 U/L   ALT 22 U/L   Alkaline Phosphatase 50 U/L   Total Bilirubin 1.2 mg/dL   GFR, Estimated >06 mL/min   Anion gap 10  CBC with Differential [[id number]] (Abnormal)   Collected: 08/03/21 0816   Updated: 08/03/21 0841   Specimen Type: Blood   Specimen Source: Vein    WBC 5.9 K/uL   RBC 4.50 MIL/uL   Hemoglobin 12.8 g/dL   HCT 70.3 Low  %   MCV 78.7 Low  fL   MCH 28.4 pg   MCHC 36.2 High  g/dL   RDW 50.0 %   Platelets 261 K/uL   nRBC 0.0 %   Neutrophils Relative % 77 %   Neutro Abs 4.5 K/uL   Lymphocytes Relative 12 %   Lymphs Abs 0.7 K/uL   Monocytes Relative 10 %   Monocytes Absolute 0.6 K/uL   Eosinophils Relative 1 %   Eosinophils Absolute 0.0 K/uL   Basophils Relative 0 %   Basophils Absolute 0.0 K/uL   Immature Granulocytes 0 %   Abs Immature Granulocytes 0.02 K/uL  Urine Culture [[id number]]   Collected: 08/03/21 0752   Updated: 08/03/21 0831   Specimen Source: Urine, Clean Catch   Urinalysis, Microscopic (reflex) [[id number]] (Abnormal)   Collected: 08/03/21 0752   Updated: 08/03/21 0820    RBC / HPF NONE  SEEN RBC/hpf   WBC, UA NONE SEEN WBC/hpf   Bacteria, UA FEW Abnormal    Squamous Epithelial / LPF NONE SEEN   Amorphous Crystal PRESENT   Triple Phosphate Crystal PRESENT  Urinalysis, Routine w reflex microscopic [[id number]] (Abnormal)   Collected: 08/03/21 0752   Updated: 08/03/21 0820   Specimen Type: Urine    Color, Urine YELLOW   APPearance CLOUDY Abnormal    Specific Gravity, Urine 1.015   pH 7.5   Glucose, UA NEGATIVE mg/dL   Hgb urine dipstick TRACE Abnormal    Bilirubin Urine NEGATIVE   Ketones, ur NEGATIVE mg/dL   Protein, ur NEGATIVE mg/dL   Nitrite NEGATIVE   Leukocytes,Ua NEGATIVE   Plan of care: The patient is accepted for admission to Telemetry unit, at [facility].  Magnesium and phosphorus level have been added.  Patient started on NS plus KCl 20 mEq at 75 mL an hour and 2 g of  magnesium sulfate IVPB ordered.  Author: Bobette Mo, MD 08/03/2021  Check [email or web address] for on-call coverage.  Nursing staff, Please call [facility] Admits & Consults System-Wide number on Amion as soon as patient's arrival, so appropriate admitting provider can evaluate the pt.

## 2021-08-03 NOTE — ED Provider Notes (Signed)
[facility] EMERGENCY DEPARTMENT Provider Note   CSN: [id number] Arrival date & time: 08/03/21  0731     History  Chief Complaint  Patient presents with   Dysuria    Elizabeth Barnett is a 81 y.o. female.  HPI     81 year old female with a history of hypertension, sick sinus w/syndrome pacemaker who presents with fatigue, decreased appetite, dysuria and nausea after being diagnosed with COVID 6 days ago.   Initially with COVID had some fever, fatigue. She was given molnupiravir but did not continue to take it because she wasn't sure if she was having side effects.  She has not wanted to eat or drink much all week and feels dry.  She has felt the need to urinate frequently but feels she has not been able to empty her bladder and has had dysuria.  Has had constant nausea, but no vomiting. Has chronic constipation, describes flaky stool over last 2 days. No significant diarrhea. Denies abdominal pain, chest pain or dyspnea. No back pain/numbness/weakness.  Yesterday she thought she was getting over her COVID but today had runny nose again. No sinus pressure or ear pain.    Past Medical History:  Diagnosis Date   Hypertension     Past Surgical History:  Procedure Laterality Date   ANAL RECTAL MANOMETRY N/A 09/11/2017   Procedure: ANO RECTAL MANOMETRY;  Surgeon: Arta Silence, MD;  Location: [facility];  Service: Endoscopy;  Laterality: N/A;  DR. Michail Sermon TO READ    Home Medications Prior to Admission medications   Medication Sig Start Date End Date Taking? Authorizing Provider  atorvastatin (LIPITOR) 20 MG tablet Take 20 mg by mouth daily.    [provider]  dexlansoprazole (DEXILANT) 60 MG capsule Take 60 mg by mouth daily.    [provider]  HYDROcodone-acetaminophen (NORCO/VICODIN) 5-325 MG tablet Take 2 tablets by mouth every 4 (four) hours as needed. 03/16/21   Valarie Merino, MD  losartan-hydrochlorothiazide (HYZAAR) 100-25 MG per tablet Take  1 tablet by mouth daily.    [provider]  polyethylene glycol (MIRALAX / GLYCOLAX) packet Take 17 g by mouth daily.    [provider]  predniSONE (DELTASONE) 50 MG tablet Take 50mg  prednisone on 02/29/20 @ 9:20pm.  Take 50mg  prednisone on 03/01/20 @ 3:20am.  Take 50mg  prenisone on 03/01/20 @ 9:20am AND 50mg  benadryl. 02/27/20   Nelson Chimes, MD  sucralfate (CARAFATE) 1 G tablet Take 1 g by mouth 4 (four) times daily -  with meals and at bedtime.    [provider]      Allergies    Bee venom, Diclofenac sodium, Doxycycline hyclate, Iodinated contrast media, Meloxicam, Nsaids, Voltaren [diclofenac sodium], Acyclovir, Denosumab, Lorazepam, Neomycin-polymyxin-dexameth, Zoledronic acid, and Promethazine    Review of Systems   Review of Systems  Physical Exam Updated Vital Signs BP 140/60 (BP Location: Left Arm)   Pulse 65   Temp 97.7 F (36.5 C) (Oral)   Resp 20   Ht 4\' 11"  (1.499 m)   Wt 54.4 kg   SpO2 99%   BMI 24.24 kg/m  Physical Exam Vitals and nursing note reviewed.  Constitutional:      General: She is not in acute distress.    Appearance: She is well-developed. She is not diaphoretic.  HENT:     Head: Normocephalic and atraumatic.  Eyes:     Conjunctiva/sclera: Conjunctivae normal.  Cardiovascular:     Rate and Rhythm: Normal rate and regular rhythm.  Heart sounds: Normal heart sounds. No murmur heard.    No friction rub. No gallop.  Pulmonary:     Effort: Pulmonary effort is normal. No respiratory distress.     Breath sounds: Normal breath sounds. No wheezing or rales.  Abdominal:     General: There is no distension.     Palpations: Abdomen is soft.     Tenderness: There is no abdominal tenderness. There is no guarding.  Musculoskeletal:        General: No tenderness.     Cervical back: Normal range of motion.  Skin:    General: Skin is warm and dry.     Findings: No erythema or rash.  Neurological:     Mental Status: She is alert  and oriented to person, place, and time.     ED Results / Procedures / Treatments   Labs (all labs ordered are listed, but only abnormal results are displayed) Labs Reviewed  SARS CORONAVIRUS 2 BY RT PCR - Abnormal; Notable for the following components:      Result Value   SARS Coronavirus 2 by RT PCR POSITIVE (*)    All other components within normal limits  URINALYSIS, ROUTINE W REFLEX MICROSCOPIC - Abnormal; Notable for the following components:   APPearance CLOUDY (*)    Hgb urine dipstick TRACE (*)    All other components within normal limits  CBC WITH DIFFERENTIAL/PLATELET - Abnormal; Notable for the following components:   HCT 35.4 (*)    MCV 78.7 (*)    MCHC 36.2 (*)    All other components within normal limits  COMPREHENSIVE METABOLIC PANEL - Abnormal; Notable for the following components:   Sodium 117 (*)    Potassium 2.6 (*)    Chloride 87 (*)    CO2 20 (*)    Glucose, Bld 140 (*)    Calcium 8.3 (*)    All other components within normal limits  URINALYSIS, MICROSCOPIC (REFLEX) - Abnormal; Notable for the following components:   Bacteria, UA FEW (*)    All other components within normal limits  OSMOLALITY - Abnormal; Notable for the following components:   Osmolality 244 (*)    All other components within normal limits  PHOSPHORUS - Abnormal; Notable for the following components:   Phosphorus 2.1 (*)    All other components within normal limits  URINE CULTURE  MAGNESIUM  OSMOLALITY, URINE  SODIUM, URINE, RANDOM    EKG EKG Interpretation  Date/Time:  Saturday August 03 2021 09:26:49 EDT Ventricular Rate:  66 PR Interval:  52 QRS Duration: 201 QT Interval:  435 QTC Calculation: 456 R Axis:   -84 Text Interpretation: Sinus rhythm Ventricular premature complex Short PR interval 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  Procedures Procedures    Medications Ordered in ED Medications  0.9 % NaCl with KCl 20 mEq/ L  infusion ( Intravenous New Bag/Given 08/03/21 1022)  lactated ringers bolus 1,000 mL (0 mLs Intravenous Stopped 08/03/21 0933)  ondansetron (ZOFRAN) injection 4 mg (4 mg Intravenous Given 08/03/21 0817)  potassium chloride SA (KLOR-CON M) CR tablet 40 mEq (40 mEq Oral Given 08/03/21 0942)  potassium chloride 10 mEq in 100 mL IVPB (10 mEq Intravenous New Bag/Given 08/03/21 1436)  0.9 %  sodium chloride infusion (0 mLs Intravenous Stopped 08/03/21 1022)  magnesium sulfate IVPB 2 g 50 mL (0 g Intravenous Stopped 08/03/21 1201)    ED Course/ Medical Decision Making/ A&P      81 year old female with a history of hypertension, sick sinus w/syndrome pacemaker who presents with fatigue, decreased appetite, dysuria and nausea after being diagnosed with COVID 6 days ago.  Reviewed past records.   No chest pain or dyspnea, doubt pneumonia, cardiac etiology of symptoms.  No abdominal pain or tenderness.  Labs obtained given fatigue and decreased appetite show sodium 117, potassium 2.6--likely in the setting of dehydration and hctz.   Urinalysis shows no sign of infection, sent for culture. Postvoid residual ordered and evaluated by nursing without report of significant retention.   GIven IV fluids, potassium, urine osm/serum sent and will be admitted for hyponatremia.         Final Clinical Impression(s) / ED Diagnoses Final diagnoses:  Hyponatremia  COVID-19  Dehydration    Rx / DC Orders ED Discharge Orders     None         Alvira Monday, MD 08/03/21 [phone]

## 2021-08-04 ENCOUNTER — Observation Stay (HOSPITAL_COMMUNITY): Payer: Medicare Other

## 2021-08-04 DIAGNOSIS — Z79899 Other long term (current) drug therapy: Secondary | ICD-10-CM | POA: Diagnosis not present

## 2021-08-04 DIAGNOSIS — I495 Sick sinus syndrome: Secondary | ICD-10-CM | POA: Diagnosis present

## 2021-08-04 DIAGNOSIS — Z888 Allergy status to other drugs, medicaments and biological substances status: Secondary | ICD-10-CM | POA: Diagnosis not present

## 2021-08-04 DIAGNOSIS — E871 Hypo-osmolality and hyponatremia: Secondary | ICD-10-CM | POA: Diagnosis present

## 2021-08-04 DIAGNOSIS — E860 Dehydration: Secondary | ICD-10-CM

## 2021-08-04 DIAGNOSIS — Z881 Allergy status to other antibiotic agents status: Secondary | ICD-10-CM | POA: Diagnosis not present

## 2021-08-04 DIAGNOSIS — Z91030 Bee allergy status: Secondary | ICD-10-CM | POA: Diagnosis not present

## 2021-08-04 DIAGNOSIS — K5909 Other constipation: Secondary | ICD-10-CM | POA: Diagnosis present

## 2021-08-04 DIAGNOSIS — Z8249 Family history of ischemic heart disease and other diseases of the circulatory system: Secondary | ICD-10-CM | POA: Diagnosis not present

## 2021-08-04 DIAGNOSIS — Z950 Presence of cardiac pacemaker: Secondary | ICD-10-CM | POA: Diagnosis not present

## 2021-08-04 DIAGNOSIS — E785 Hyperlipidemia, unspecified: Secondary | ICD-10-CM | POA: Diagnosis present

## 2021-08-04 DIAGNOSIS — K219 Gastro-esophageal reflux disease without esophagitis: Secondary | ICD-10-CM | POA: Diagnosis present

## 2021-08-04 DIAGNOSIS — U071 COVID-19: Secondary | ICD-10-CM | POA: Diagnosis present

## 2021-08-04 DIAGNOSIS — Z91041 Radiographic dye allergy status: Secondary | ICD-10-CM | POA: Diagnosis not present

## 2021-08-04 DIAGNOSIS — E876 Hypokalemia: Secondary | ICD-10-CM | POA: Diagnosis present

## 2021-08-04 DIAGNOSIS — R300 Dysuria: Secondary | ICD-10-CM | POA: Diagnosis present

## 2021-08-04 DIAGNOSIS — I10 Essential (primary) hypertension: Secondary | ICD-10-CM | POA: Diagnosis present

## 2021-08-04 DIAGNOSIS — Z886 Allergy status to analgesic agent status: Secondary | ICD-10-CM | POA: Diagnosis not present

## 2021-08-04 LAB — BASIC METABOLIC PANEL
Anion gap: 5 (ref 5–15)
BUN: 13 mg/dL (ref 8–23)
CO2: 20 mmol/L — ABNORMAL LOW (ref 22–32)
Calcium: 8.4 mg/dL — ABNORMAL LOW (ref 8.9–10.3)
Chloride: 104 mmol/L (ref 98–111)
Creatinine, Ser: 0.68 mg/dL (ref 0.44–1.00)
GFR, Estimated: >60 mL/min (ref >60)
Glucose, Bld: 107 mg/dL — ABNORMAL HIGH (ref 70–99)
Potassium: 4.5 mmol/L (ref 3.5–5.1)
Sodium: 129 mmol/L — ABNORMAL LOW (ref 135–145)

## 2021-08-04 LAB — COMPREHENSIVE METABOLIC PANEL
ALT: 26 U/L (ref 0–44)
AST: 29 U/L (ref 15–41)
Albumin: 3.8 g/dL (ref 3.5–5.0)
Alkaline Phosphatase: 45 U/L (ref 38–126)
Anion gap: 6 (ref 5–15)
BUN: 11 mg/dL (ref 8–23)
CO2: 19 mmol/L — ABNORMAL LOW (ref 22–32)
Calcium: 7.9 mg/dL — ABNORMAL LOW (ref 8.9–10.3)
Chloride: 103 mmol/L (ref 98–111)
Creatinine, Ser: 0.63 mg/dL (ref 0.44–1.00)
GFR, Estimated: >60 mL/min (ref >60)
Glucose, Bld: 94 mg/dL (ref 70–99)
Potassium: 4.7 mmol/L (ref 3.5–5.1)
Sodium: 128 mmol/L — ABNORMAL LOW (ref 135–145)
Total Bilirubin: 1 mg/dL (ref 0.3–1.2)
Total Protein: 6.2 g/dL — ABNORMAL LOW (ref 6.5–8.1)

## 2021-08-04 LAB — CBC
HCT: 35.1 % — ABNORMAL LOW (ref 36.0–46.0)
Hemoglobin: 11.9 g/dL — ABNORMAL LOW (ref 12.0–15.0)
MCH: 28.1 pg (ref 26.0–34.0)
MCHC: 33.9 g/dL (ref 30.0–36.0)
MCV: 82.8 fL (ref 80.0–100.0)
Platelets: 280 10*3/uL (ref 150–400)
RBC: 4.24 MIL/uL (ref 3.87–5.11)
RDW: 13.9 % (ref 11.5–15.5)
WBC: 6.4 10*3/uL (ref 4.0–10.5)
nRBC: 0 % (ref 0.0–0.2)

## 2021-08-04 LAB — URINE CULTURE

## 2021-08-04 LAB — C-REACTIVE PROTEIN: CRP: 0.5 mg/dL (ref <1.0)

## 2021-08-04 IMAGING — DX DG CHEST 1V PORT
1 series · 1 of 1 positions shown · non-contrast
Comparison: Chest x-ray [DATE]

CLINICAL DATA: [PHONE_NUMBER]. Reason for exam: COVID, pt denies cp/sob Hx
of HTN, nonsmoke

EXAM:
PORTABLE CHEST 1 VIEW

[chest ap]
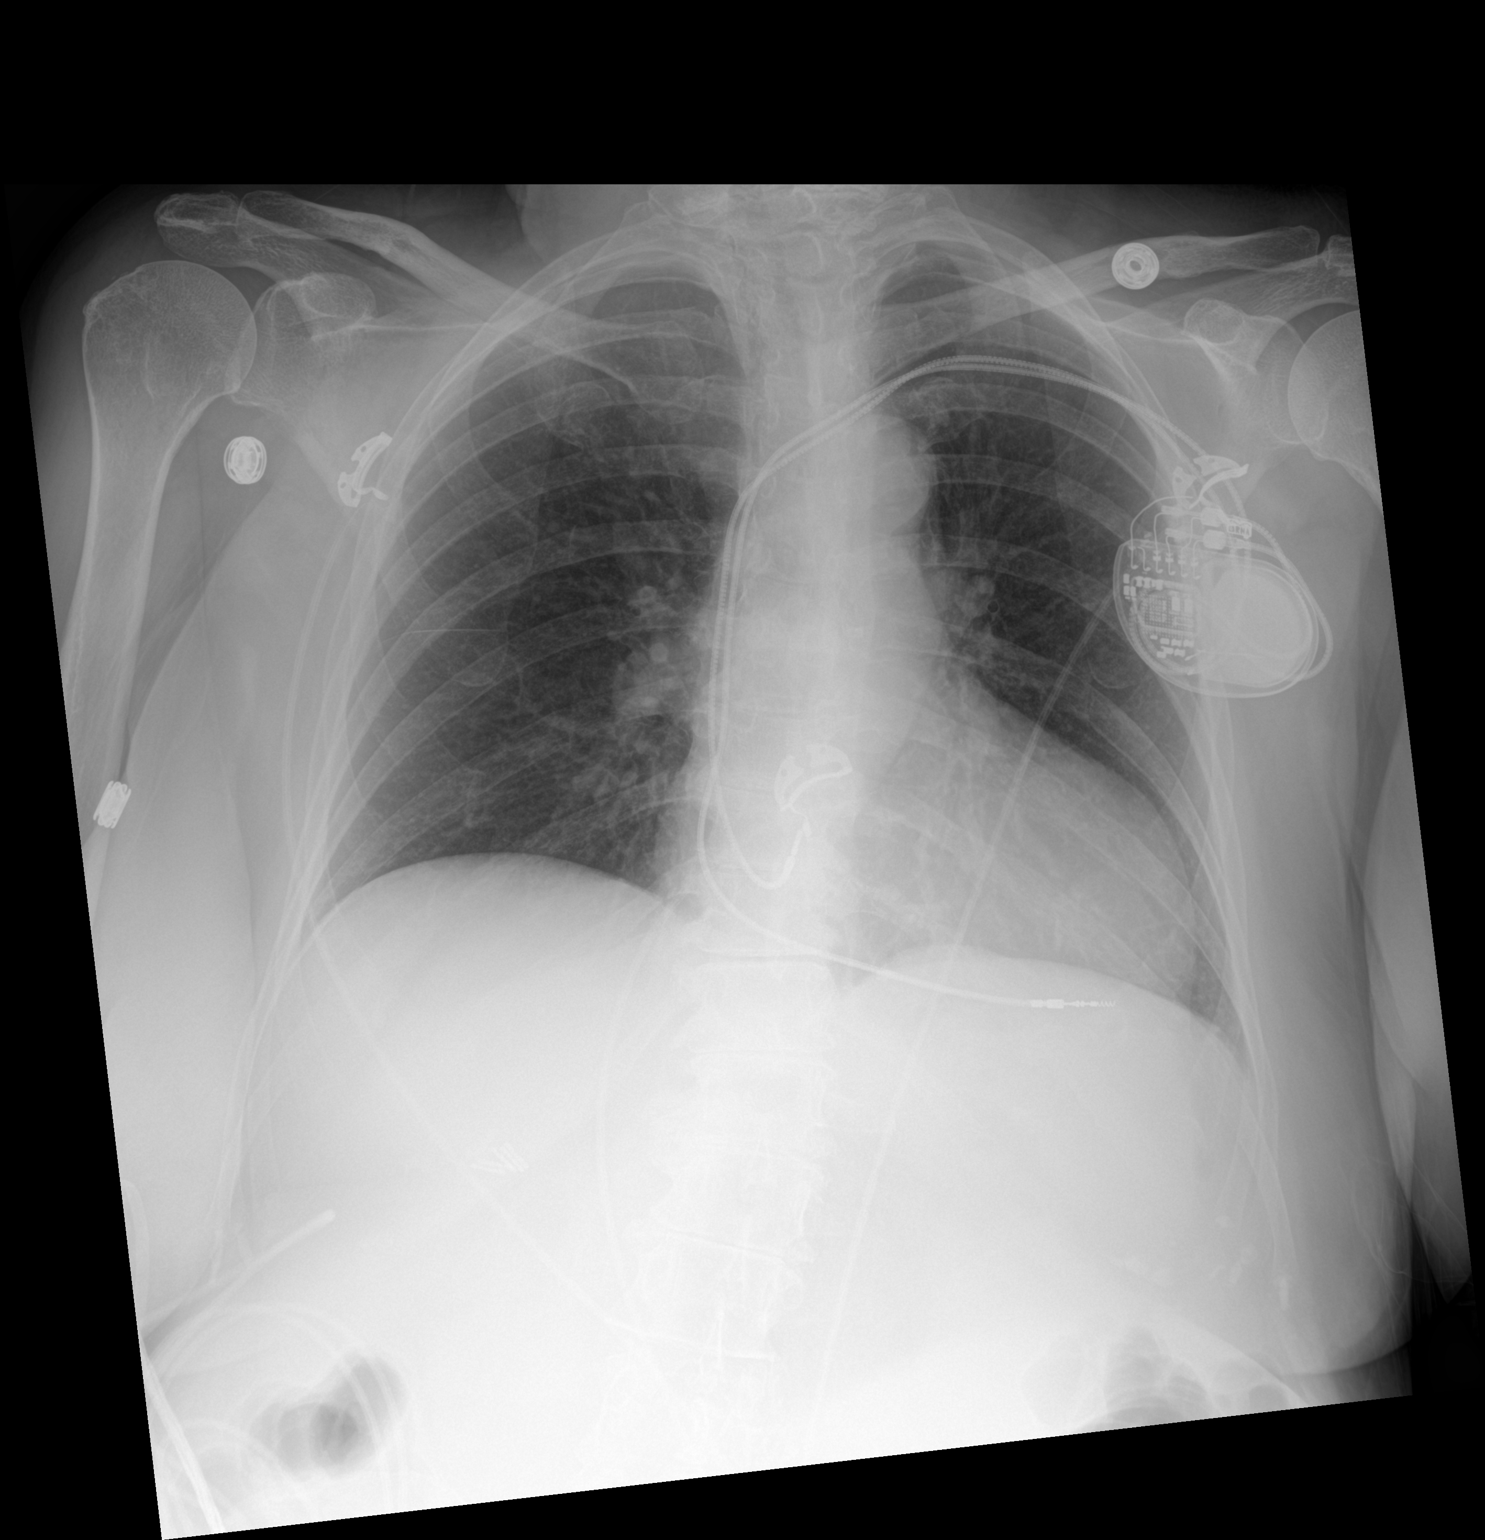

[1 of 1 positions shown; findings below may reference images not displayed]

FINDINGS: Stable 2 lead left chest wall cardiac pacemaker.

The heart and mediastinal contours are within normal limits.

No focal consolidation. No pulmonary edema. No pleural effusion. No
pneumothorax.

No acute osseous abnormality.
IMPRESSION: No active disease.

## 2021-08-04 MED ORDER — ASCORBIC ACID 500 MG PO TABS
500.0000 mg | ORAL_TABLET | Freq: Every day | ORAL | Status: DC
  Administered 2021-08-04 – 2021-08-05 (×2): 500 mg via ORAL
  Filled 2021-08-04 (×2): qty 1

## 2021-08-04 MED ORDER — ZINC SULFATE 220 (50 ZN) MG PO CAPS
220.0000 mg | ORAL_CAPSULE | Freq: Every day | ORAL | Status: DC
  Administered 2021-08-04 – 2021-08-05 (×2): 220 mg via ORAL
  Filled 2021-08-04 (×2): qty 1

## 2021-08-04 MED ORDER — POLYETHYLENE GLYCOL 3350 17 G PO PACK
17.0000 g | PACK | Freq: Two times a day (BID) | ORAL | Status: DC
  Administered 2021-08-04 – 2021-08-05 (×2): 17 g via ORAL
  Filled 2021-08-04 (×2): qty 1

## 2021-08-04 NOTE — Hospital Course (Signed)
81 y.o. female with medical history significant of sick sinus syndrome with pacemaker, hypertension, recent diagnosis of COVID who presents to the emergency department initially with complaints of burning with urination.  Patient was later found to be hyponatremic with sodium of 117..   Of note, patient was diagnosed with COVID 6 days prior to this visit.  Patient was given molnupiravir but did not continue past 2 days because of side effects.  Patient subsequently did not eat or drink very well.  Also had nausea without vomiting

## 2021-08-04 NOTE — Progress Notes (Signed)
  Progress Note   Patient: Elizabeth Barnett SJG:[id number] DOB: [birth year] DOA: 08/03/2021     0 DOS: the patient was seen and examined on 08/04/2021   Brief hospital course: 81 y.o. female with medical history significant of sick sinus syndrome with pacemaker, hypertension, recent diagnosis of COVID who presents to the emergency department initially with complaints of burning with urination.  Patient was later found to be hyponatremic with sodium of 117..   Of note, patient was diagnosed with COVID 6 days prior to this visit.  Patient was given molnupiravir but did not continue past 2 days because of side effects.  Patient subsequently did not eat or drink very well.  Also had nausea without vomiting  Assessment and Plan: No notes have been filed under this hospital service. Service: Hospitalist Hyponatremia -Presenting sodium of 117, baseline sodium in the low to mid 130s -Patient does appear dehydrated on exam with dry mucous membranes and poor skin turgor in the setting of poor p.o. intake -Na improved to 129 this afternoon with IVF -Patient had previously been on Hyzaar for blood pressure.  Now d/c'd  Hypokalemia -Corrected -Recheck bmet in AM  COVID -Initially, patient largely asymptomatic with only mild runny nose as symptom -Remains on room air -We will continue on vitamin C and zinc -This AM, pt with increased malaise -Ordered and reviewed CXR, clear -CRP ordered, pending. If CRP elevated, then consider initiating remdesivir. Pt reports intolerance to paxlovid and molnupiravir  Hypertension -Given hyponatremia, would continue to hold Hyzaar -Cont on low-dose Norvasc for blood pressure instead  History of sick sinus syndrome -Patient is status post pacemaker placement -Heart rate appears stable at this time, continued on telemetry       Subjective: Feeling weaker today  Physical Exam: Vitals:   08/03/21 1607 08/03/21 2136 08/04/21 0108 08/04/21 1314  BP: 140/60 125/62  (!) 105/55 (!) 132/57  Pulse: 65  (!) 54 63  Resp: 20 19 17 18   Temp: 97.7 F (36.5 C) 98.1 F (36.7 C) 98.3 F (36.8 C) 97.6 F (36.4 C)  TempSrc: Oral Oral    SpO2: 99% 98% 96% 100%  Weight:      Height: 4\' 11"  (1.499 m)      General exam: Awake, laying in bed, in nad Respiratory system: Normal respiratory effort, no wheezing Cardiovascular system: regular rate, s1, s2 Gastrointestinal system: Soft, nondistended, positive BS Central nervous system: CN2-12 grossly intact, strength intact Extremities: Perfused, no clubbing Skin: Normal skin turgor, no notable skin lesions seen Psychiatry: Mood normal // no visual hallucinations   Data Reviewed:  Labs reviewed: 129, Cr 0.68  Family Communication: Pt in room, family at bedside  Disposition: Status is: Observation The patient will require care spanning > 2 midnights and should be moved to inpatient because: severity of illness  Planned Discharge Destination: Home     Author: , MD 08/04/2021 5:03 PM  For on call review [email or web address].

## 2021-08-05 DIAGNOSIS — E871 Hypo-osmolality and hyponatremia: Secondary | ICD-10-CM | POA: Diagnosis not present

## 2021-08-05 DIAGNOSIS — U071 COVID-19: Secondary | ICD-10-CM | POA: Diagnosis not present

## 2021-08-05 LAB — D-DIMER, QUANTITATIVE: D-Dimer, Quant: 0.38 ug/mL-FEU (ref 0.00–0.50)

## 2021-08-05 LAB — COMPREHENSIVE METABOLIC PANEL
ALT: 23 U/L (ref 0–44)
AST: 25 U/L (ref 15–41)
Albumin: 3.7 g/dL (ref 3.5–5.0)
Alkaline Phosphatase: 45 U/L (ref 38–126)
Anion gap: 6 (ref 5–15)
BUN: 8 mg/dL (ref 8–23)
CO2: 18 mmol/L — ABNORMAL LOW (ref 22–32)
Calcium: 8.2 mg/dL — ABNORMAL LOW (ref 8.9–10.3)
Chloride: 107 mmol/L (ref 98–111)
Creatinine, Ser: 0.62 mg/dL (ref 0.44–1.00)
GFR, Estimated: >60 mL/min (ref >60)
Glucose, Bld: 99 mg/dL (ref 70–99)
Potassium: 4.3 mmol/L (ref 3.5–5.1)
Sodium: 131 mmol/L — ABNORMAL LOW (ref 135–145)
Total Bilirubin: 0.7 mg/dL (ref 0.3–1.2)
Total Protein: 6.3 g/dL — ABNORMAL LOW (ref 6.5–8.1)

## 2021-08-05 LAB — SEDIMENTATION RATE: Sed Rate: 8 mm/hr (ref 0–22)

## 2021-08-05 LAB — CBC
HCT: 34.6 % — ABNORMAL LOW (ref 36.0–46.0)
Hemoglobin: 11.6 g/dL — ABNORMAL LOW (ref 12.0–15.0)
MCH: 27.8 pg (ref 26.0–34.0)
MCHC: 33.5 g/dL (ref 30.0–36.0)
MCV: 83 fL (ref 80.0–100.0)
Platelets: 261 10*3/uL (ref 150–400)
RBC: 4.17 MIL/uL (ref 3.87–5.11)
RDW: 14.6 % (ref 11.5–15.5)
WBC: 6.3 10*3/uL (ref 4.0–10.5)
nRBC: 0 % (ref 0.0–0.2)

## 2021-08-05 LAB — C-REACTIVE PROTEIN: CRP: 0.5 mg/dL (ref <1.0)

## 2021-08-05 MED ORDER — AMLODIPINE BESYLATE 5 MG PO TABS
5.0000 mg | ORAL_TABLET | Freq: Every day | ORAL | 0 refills | Status: DC
Start: 2021-08-06 — End: 2022-01-22

## 2021-08-05 NOTE — Discharge Summary (Signed)
Physician Discharge Summary   Patient: Elizabeth Barnett MRN: [id number] DOB: 19-Mar-1940  Admit date:     08/03/2021  Discharge date: 08/05/21  Discharge Physician: Marylu Lund   PCP: Loraine Leriche., MD   Recommendations at discharge:    Follow up with PCP in 1-2 weeks Recommend repeat bmet in 1 week, focus on Na  Discharge Diagnoses: Principal Problem:   Hyponatremia Active Problems:   Hyperlipidemia LDL goal <130   Essential hypertension   Esophageal reflux   Hypokalemia  Resolved Problems:   * No resolved hospital problems. *  Hospital Course: 81 y.o. female with medical history significant of sick sinus syndrome with pacemaker, hypertension, recent diagnosis of COVID who presents to the emergency department initially with complaints of burning with urination.  Patient was later found to be hyponatremic with sodium of 117..   Of note, patient was diagnosed with COVID 6 days prior to this visit.  Patient was given molnupiravir but did not continue past 2 days because of side effects.  Patient subsequently did not eat or drink very well.  Also had nausea without vomiting  Assessment and Plan: Hyponatremia -Presenting sodium of 117, baseline sodium in the low to mid 130s -Patient does appear dehydrated on exam with dry mucous membranes and poor skin turgor in the setting of poor p.o. intake -Na improved to 131 after NS hydration and after holding Hyzaar -Recommend continuing to hold hyzaar or any HCTZ containing regimen moving forward -Recommend repeat bmet in one week  Hypokalemia -Corrected  COVID -Initially, patient largely asymptomatic with only mild runny nose as symptom -Remains on room air -We will continue on vitamin C and zinc -Ordered and reviewed CXR, clear -CRP and other inflammatory markers reviewed, all neg -Pt reports no real symptoms  Hypertension -Given hyponatremia, would continue to hold Hyzaar -Tolerating low-dose Norvasc for blood  pressure  History of sick sinus syndrome -Patient is status post pacemaker placement -Heart rate appears stable at this time        Consultants:  Procedures performed:   Disposition: Home Diet recommendation:  Regular diet DISCHARGE MEDICATION: Allergies as of 08/05/2021       Reactions   Bee Venom Anaphylaxis   Diclofenac Sodium Anaphylaxis, Hives   Doxycycline Hyclate Diarrhea   Iodinated Contrast Media Hives, Itching   Meloxicam Itching      Molnupiravir Nausea Only, Other (See Comments)   Extreme nausea   Nsaids Anaphylaxis   Voltaren [diclofenac Sodium] Other (See Comments)   Reaction not recalled   Acyclovir Hives      Denosumab Hives, Other (See Comments)   PROLIA injection (patient questioning this in 2023)   Lorazepam Anxiety, Other (See Comments)   "Made me feel very uptight"   Neomycin-polymyxin-dexameth Other (See Comments)   EYE PAIN   Zoledronic Acid Other (See Comments)   RECLAST injection = Headache   Amlodipine Other (See Comments)   Patient stated she might be intolerant of this (??)   Promethazine Other (See Comments)   Patient FAINTED (patient questioning this in 2023)        Medication List     STOP taking these medications    HYDROcodone-acetaminophen 5-325 MG tablet Commonly known as: NORCO/VICODIN   losartan-hydrochlorothiazide 100-25 MG tablet Commonly known as: HYZAAR   predniSONE 50 MG tablet Commonly known as: DELTASONE       TAKE these medications    amLODipine 5 MG tablet Commonly known as: NORVASC Take 1 tablet (5 mg total) by mouth  daily. Start taking on: August 06, 2021   atorvastatin 20 MG tablet Commonly known as: LIPITOR Take 20 mg by mouth at bedtime.   BENEFIBER PO Take by mouth See admin instructions. Mix 2 tablespoonsful of powder into 4-6 ounces of water and drink every morning and at bedtime   Eliquis 2.5 MG Tabs tablet Generic drug: apixaban Take 2.5 mg by mouth in the morning and at bedtime.    flecainide 50 MG tablet Commonly known as: TAMBOCOR Take 50 mg by mouth in the morning and at bedtime.   MAGNESIUM GLUCONATE PO Take 1 tablet by mouth 3 (three) times daily with meals.   NON FORMULARY Place 1 Application into both eyes See admin instructions. Apply a very warm washcloth to both eyes every morning and afternoon   polyethylene glycol 17 g packet Commonly known as: MIRALAX / GLYCOLAX Take 17 g by mouth in the morning and at bedtime.   Restasis 0.05 % ophthalmic emulsion Generic drug: cycloSPORINE Place 1 drop into both eyes in the morning and at bedtime.   Systane Ultra PF 0.4-0.3 % Soln Generic drug: Polyethyl Glyc-Propyl Glyc PF Place 1 drop into both eyes See admin instructions. Place 1 drop into both eyes in the morning and at bedtime- may also use throughout the day as needed/as directed for dryness   vitamin B-12 1000 MCG tablet Commonly known as: CYANOCOBALAMIN Take 1,000 mcg by mouth daily.   Vitamin D3 25 MCG (1000 UT) Caps Take 1,000 Units by mouth in the morning.        Follow-up Information     Cheron Schaumann., MD Follow up in 21 week(s).   Specialty: Internal Medicine Why: Hospital follow up Contact information: [address] [place] [address] [phone]                Discharge Exam: Ceasar Mons Weights   08/03/21 0749  Weight: 54.4 kg   General exam: Awake, laying in bed, in nad Respiratory system: Normal respiratory effort, no wheezing Cardiovascular system: regular rate, s1, s2 Gastrointestinal system: Soft, nondistended, positive BS Central nervous system: CN2-12 grossly intact, strength intact Extremities: Perfused, no clubbing Skin: Normal skin turgor, no notable skin lesions seen Psychiatry: Mood normal // no visual hallucinations   Condition at discharge: fair  The results of significant diagnostics from this hospitalization (including imaging, microbiology, ancillary and laboratory) are listed below for  reference.   Imaging Studies: DG CHEST PORT 1 VIEW  Result Date: 08/04/2021 CLINICAL DATA:  [id number]. Reason for exam: COVID, pt denies cp/sob Hx of HTN, nonsmoke EXAM: PORTABLE CHEST 1 VIEW COMPARISON:  Chest x-ray 09/27/2020 FINDINGS: Stable 2 lead left chest wall cardiac pacemaker. The heart and mediastinal contours are within normal limits. No focal consolidation. No pulmonary edema. No pleural effusion. No pneumothorax. No acute osseous abnormality. IMPRESSION: No active disease. Electronically Signed   By: Tish Frederickson M.D.   On: 08/04/2021 15:55   MR SHOULDER RIGHT WO CONTRAST  Result Date: 07/27/2021 CLINICAL DATA:  Chronic right shoulder weakness. EXAM: MRI OF THE RIGHT SHOULDER WITHOUT CONTRAST TECHNIQUE: Multiplanar, multisequence MR imaging of the shoulder was performed. No intravenous contrast was administered. COMPARISON:  None Available. FINDINGS: Rotator cuff: Severe tendinosis of the supraspinatus tendon with a complete tear and 2 cm of retraction. Moderate tendinosis of the infraspinatus tendon. High-grade partial-thickness tear of the subscapularis tendon. Teres minor tendon is intact. Muscles: No muscle atrophy or edema. No intramuscular fluid collection or hematoma. Biceps Long Head: Severe tendinosis  of the intra-articular portion of the long head of the biceps tendon. Medial subluxation of the proximal extra-articular portion of the biceps tendon from the bicipital groove. Acromioclavicular Joint: Severe arthropathy of the acromioclavicular joint. Trace subacromial/subdeltoid bursal fluid. Glenohumeral Joint: Moderate joint effusion. Partial-thickness cartilage loss of the glenohumeral joint. Labrum: Grossly intact, but evaluation is limited by lack of intraarticular fluid/contrast. Bones: No fracture or dislocation. No aggressive osseous lesion. Mild subcortical reactive marrow changes at the supraspinatus insertion. Other: No fluid collection or hematoma. IMPRESSION: 1. Severe  tendinosis of the supraspinatus tendon with a complete tear and 2 cm of retraction. 2. Moderate tendinosis of the infraspinatus tendon. 3. High-grade partial-thickness tear of the subscapularis tendon. 4. Severe tendinosis of the intra-articular portion of the long head of the biceps tendon. Medial subluxation of the proximal extra-articular portion of the biceps tendon from the bicipital groove. 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 Electronically Signed   By: Elige Ko M.D.   On: 07/27/2021 07:47    Microbiology: Results for orders placed or performed during the hospital encounter of 08/03/21  Urine Culture     Status: Abnormal   Collection Time: 08/03/21  7:52 AM   Specimen: Urine, Clean Catch  Result Value Ref Range Status   Specimen Description   Final    URINE, CLEAN CATCH Performed at [facility], [address] [place], [place] [address]    Special Requests   Final    NONE Performed at [facility], [address] [place], [place] [address]    Culture MULTIPLE SPECIES PRESENT, SUGGEST RECOLLECTION (A)  Final   Report Status 08/04/2021 FINAL  Final  SARS Coronavirus 2 by RT PCR (hospital order, performed in [facility] hospital lab) *cepheid single result test* Anterior Nasal Swab     Status: Abnormal   Collection Time: 08/03/21 10:23 AM   Specimen: Anterior Nasal Swab  Result Value Ref Range Status   SARS Coronavirus 2 by RT PCR POSITIVE (A) NEGATIVE Final    Comment: (NOTE) SARS-CoV-2 target nucleic acids are DETECTED  SARS-CoV-2 RNA is generally detectable in upper respiratory specimens  during the acute phase of infection.  Positive results are indicative  of the presence of the identified virus, but do not rule out bacterial infection or co-infection with other pathogens not detected by the test.  Clinical correlation with patient history and  other diagnostic information is necessary to determine patient infection status.   The expected result is negative.  Fact Sheet for Patients:   RoadLapTop.co.za   Fact Sheet for Healthcare Providers:   http://kim-miller.com/    This test is not yet approved or cleared by the [place] FDA and  has been authorized for detection and/or diagnosis of SARS-CoV-2 by FDA under an Emergency Use Authorization (EUA).  This EUA will remain in effect (meaning this test can be used) for the duration of  the COVID-19 declaration under Section 564(b)(1)  of the Act, 21 U.S.C. section 360-bbb-3(b)(1), unless the authorization is terminated or revoked sooner.   Performed at [facility], [address] [place], [place] [address]     Labs: CBC: Recent Labs  Lab 08/03/21 0816 08/04/21 0348 08/05/21 0322  WBC 5.9 6.4 6.3  NEUTROABS 4.5  --   --   HGB 12.8 11.9* 11.6*  HCT 35.4* 35.1* 34.6*  MCV 78.7* 82.8 83.0  PLT 261 280 261   Basic Metabolic Panel: Recent Labs  Lab 08/03/21  [id number] 08/04/21 0348 08/04/21 1408 08/05/21 0322  NA 117* 128* 129* 131*  K 2.6* 4.7 4.5 4.3  CL 87* 103 104 107  CO2 20* 19* 20* 18*  GLUCOSE 140* 94 107* 99  BUN 9 11 13 8   CREATININE 0.67 0.63 0.68 0.62  CALCIUM 8.3* 7.9* 8.4* 8.2*  MG 1.9  --   --   --   PHOS 2.1*  --   --   --    Liver Function Tests: Recent Labs  Lab 08/03/21 0816 08/04/21 0348 08/05/21 0322  AST 31 29 25   ALT 22 26 23   ALKPHOS 50 45 45  BILITOT 1.2 1.0 0.7  PROT 6.8 6.2* 6.3*  ALBUMIN 4.0 3.8 3.7   CBG: No results for input(s): "GLUCAP" in the last 168 hours.  Discharge time spent: less than 30 minutes.  Signed: Marylu Lund, MD [facility] 08/05/2021

## 2021-08-05 NOTE — TOC Progression Note (Addendum)
Transition of Care [facility]) - Progression Note    Patient Details  Name: Elizabeth Barnett MRN: [id number] Date of Birth: 1940/06/23  Transition of Care [facility]) CM/SW Contact  Geni Bers, RN Phone Number: 08/05/2021, 1:56 PM  Clinical Narrative:      Transition of Care (TOC) Screening Note   Patient Details  Name: Elizabeth Barnett Date of Birth: 08-31-1940   Transition of Care [facility]) CM/SW Contact:    Geni Bers, RN Phone Number: 08/05/2021, 1:57 PM    Transition of Care Department [facility]) has reviewed patient and no TOC needs have been identified at this time. We will continue to monitor patient advancement through interdisciplinary progression rounds. If new patient transition needs arise, please place a TOC consult.         Expected Discharge Plan and Services           Expected Discharge Date: 08/05/21                                     Social Determinants of Health (SDOH) Interventions    Readmission Risk Interventions     No data to display

## 2021-08-21 ENCOUNTER — Ambulatory Visit (HOSPITAL_COMMUNITY): Payer: Medicare Other

## 2021-09-15 ENCOUNTER — Other Ambulatory Visit: Payer: Self-pay

## 2021-09-15 ENCOUNTER — Emergency Department (HOSPITAL_BASED_OUTPATIENT_CLINIC_OR_DEPARTMENT_OTHER): Payer: Medicare Other

## 2021-09-15 ENCOUNTER — Emergency Department (HOSPITAL_BASED_OUTPATIENT_CLINIC_OR_DEPARTMENT_OTHER)
Admission: EM | Admit: 2021-09-15 | Discharge: 2021-09-15 | Disposition: A | Discharge status: Home / Self Care | Payer: Medicare Other | Attending: Emergency Medicine | Admitting: Emergency Medicine

## 2021-09-15 ENCOUNTER — Encounter (HOSPITAL_BASED_OUTPATIENT_CLINIC_OR_DEPARTMENT_OTHER): Payer: Self-pay | Admitting: Emergency Medicine

## 2021-09-15 DIAGNOSIS — M06261 Rheumatoid bursitis, right knee: Secondary | ICD-10-CM | POA: Diagnosis not present

## 2021-09-15 DIAGNOSIS — Z7901 Long term (current) use of anticoagulants: Secondary | ICD-10-CM | POA: Insufficient documentation

## 2021-09-15 DIAGNOSIS — M25561 Pain in right knee: Secondary | ICD-10-CM | POA: Diagnosis present

## 2021-09-15 DIAGNOSIS — M7051 Other bursitis of knee, right knee: Secondary | ICD-10-CM

## 2021-09-15 MED ORDER — OXYCODONE-ACETAMINOPHEN 5-325 MG PO TABS: 1.0000 | ORAL_TABLET | Freq: Four times a day (QID) | ORAL | 0 refills | Status: DC | PRN

## 2021-09-15 MED ORDER — OXYCODONE-ACETAMINOPHEN 5-325 MG PO TABS
1.0000 | ORAL_TABLET | Freq: Once | ORAL | Status: AC
  Administered 2021-09-15: 1 via ORAL
  Filled 2021-09-15: qty 1

## 2021-09-15 MED ORDER — SENNOSIDES-DOCUSATE SODIUM 8.6-50 MG PO TABS
1.0000 | ORAL_TABLET | Freq: Every day | ORAL | 0 refills | Status: DC
Start: 2021-09-15 — End: 2022-01-22

## 2021-09-15 MED ORDER — SENNOSIDES-DOCUSATE SODIUM 8.6-50 MG PO TABS: 1.0000 | ORAL_TABLET | Freq: Every day | ORAL | 0 refills | Status: DC

## 2021-09-15 NOTE — ED Notes (Signed)
XR at bedside

## 2021-09-15 NOTE — ED Triage Notes (Signed)
Pt arrives pov, to triage in wheelchair, c/o right anterior and posterior knee pain with weakness after massage yesterday morning. Pain not present when sitting or lying. Denies injury

## 2021-09-15 NOTE — Discharge Instructions (Addendum)
Follow-up with your orthopedic doctor, call their office tomorrow.  Recommend using the Percocet as needed for pain.  At this can make you drowsy and should not be taken while driving or operating heavy machinery.  Additionally recommend taking stool softener as this can cause constipation.  If you have any redness, increasing swelling, fever, come back to ER for reassessment.

## 2021-09-15 NOTE — ED Provider Notes (Signed)
[facility] EMERGENCY DEPARTMENT Provider Note   CSN: [id number] Arrival date & time: 09/15/21  9892     History  Chief Complaint  Patient presents with   Knee Pain    Elizabeth Barnett is a 81 y.o. female.  Presented to ER for knee pain.  Patient reports that she got a massage yesterday, was laying on her belly for prolonged period of time.  Her right knee was resting on the massage table.  After the massage she noted some pain in her right knee and throughout the day her pain was relatively constant, still having pain today.  Also noted some swelling right above the knee.  No redness, has been able to walk but with some pain.  At rest does not have significant pain.  She has seen Dr. Margreta Journey for unrelated issues.  Gets occasional knee pain but not on a regular basis. HPI     Home Medications Prior to Admission medications   Medication Sig Start Date End Date Taking? Authorizing Provider  oxyCODONE-acetaminophen (PERCOCET/ROXICET) 5-325 MG tablet Take 1 tablet by mouth every 6 (six) hours as needed for severe pain. 09/15/21  Yes Jaivyn Gulla, Quitman Livings, MD  senna-docusate (SENOKOT-S) 8.6-50 MG tablet Take 1 tablet by mouth daily. 09/15/21  Yes Milagros Loll, MD  amLODipine (NORVASC) 5 MG tablet Take 1 tablet (5 mg total) by mouth daily. 08/06/21 09/05/21  Jerald Kief, MD  atorvastatin (LIPITOR) 20 MG tablet Take 20 mg by mouth at bedtime.    [provider]  Cholecalciferol (VITAMIN D3) 25 MCG (1000 UT) CAPS Take 1,000 Units by mouth in the morning.    [provider]  ELIQUIS 2.5 MG TABS tablet Take 2.5 mg by mouth in the morning and at bedtime.    [provider]  flecainide (TAMBOCOR) 50 MG tablet Take 50 mg by mouth in the morning and at bedtime. 07/31/21   [provider]  MAGNESIUM GLUCONATE PO Take 1 tablet by mouth 3 (three) times daily with meals.    [provider]  NON FORMULARY Place 1 Application into both eyes See admin  instructions. Apply a very warm washcloth to both eyes every morning and afternoon    [provider]  polyethylene glycol (MIRALAX / GLYCOLAX) packet Take 17 g by mouth in the morning and at bedtime.    [provider]  RESTASIS 0.05 % ophthalmic emulsion Place 1 drop into both eyes in the morning and at bedtime.    [provider]  SYSTANE ULTRA PF 0.4-0.3 % SOLN Place 1 drop into both eyes See admin instructions. Place 1 drop into both eyes in the morning and at bedtime- may also use throughout the day as needed/as directed for dryness    [provider]  vitamin B-12 (CYANOCOBALAMIN) 1000 MCG tablet Take 1,000 mcg by mouth daily.    [provider]  Wheat Dextrin (BENEFIBER PO) Take by mouth See admin instructions. Mix 2 tablespoonsful of powder into 4-6 ounces of water and drink every morning and at bedtime    [provider]      Allergies    Bee venom, Diclofenac sodium, Doxycycline hyclate, Iodinated contrast media, Meloxicam, Molnupiravir, Nsaids, Voltaren [diclofenac sodium], Acyclovir, Denosumab, Lorazepam, Neomycin-polymyxin-dexameth, Zoledronic acid, Amlodipine, and Promethazine    Review of Systems   Review of Systems  Musculoskeletal:  Positive for arthralgias.  All other systems reviewed and are negative.   Physical Exam Updated Vital Signs BP (!) 153/70  Pulse 67   Temp 98.6 F (37 C) (Oral)   Resp 16   Ht 4\' 11"  (1.499 m)   Wt 54.4 kg   SpO2 100%   BMI 24.24 kg/m  Physical Exam Vitals and nursing note reviewed.  Constitutional:      General: She is not in acute distress.    Appearance: She is well-developed.  HENT:     Head: Normocephalic and atraumatic.  Eyes:     Conjunctiva/sclera: Conjunctivae normal.  Cardiovascular:     Rate and Rhythm: Normal rate and regular rhythm.     Heart sounds: No murmur heard. Pulmonary:     Effort: Pulmonary effort is normal. No respiratory distress.  Musculoskeletal:      Cervical back: Neck supple.     Comments: Right lower extremity: There is slight swelling just proximal to the anterior knee, some tenderness to palpation to the proximal anterior knee, patient has some pain with range of motion but is able to fully extend and flex knee to about 90 degrees without significant difficulty, there is no tenderness over the tibial plateau region  Skin:    General: Skin is warm and dry.     Capillary Refill: Capillary refill takes less than 2 seconds.  Neurological:     Mental Status: She is alert.  Psychiatric:        Mood and Affect: Mood normal.     ED Results / Procedures / Treatments   Labs (all labs ordered are listed, but only abnormal results are displayed) Labs Reviewed - No data to display  EKG None  Radiology DG Knee Complete 4 Views Right  Result Date: 09/15/2021 CLINICAL DATA:  An 81 year old female presents for evaluation of RIGHT knee pain. EXAM: RIGHT KNEE - COMPLETE 4+ VIEW COMPARISON:  March 16, 2021. FINDINGS: No visible fracture.  No sign of dislocation. Moderate suprapatellar effusion and soft tissue swelling in the suprapatellar region. 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 IMPRESSION: 1. Moderate suprapatellar effusion and soft tissue swelling. No acute osseous abnormality. 2. 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 Electronically Signed   By: Zetta Bills M.D.   On: 09/15/2021 08:38    Procedures Procedures    Medications Ordered in ED Medications  oxyCODONE-acetaminophen (PERCOCET/ROXICET) 5-325 MG per tablet 1 tablet (1 tablet Oral Given 09/15/21 0935)    ED Course/ Medical Decision Making/ A&P                           Medical Decision Making Amount and/or Complexity of Data Reviewed Radiology: ordered.  Risk OTC drugs. Prescription drug management.   81 year old  lady presenting to ER due to concern for right knee pain.  On physical exam she does have a mild amount of swelling and tenderness just proximal to her knee.  X-ray of knee independently reviewed and interpreted by myself, no fracture or dislocation.  Radiologist did comment on suprapatellar effusion as well as osteoarthritic changes.  Based on patient's history, x-ray and physical exam I suspect most likely suprapatellar bursitis.  NSAIDs not contraindicated due to anticoagulation use.  Provided Ace wrap with gentle compression.  She is neurovascular intact.  She does not have frank knee effusion and no erythema or warmth or fever, range of motion is mostly intact, doubt septic joint.  I have low suspicion for septic bursitis.  We will give her short Rx for some Percocet.  I  advised close follow-up with orthopedic surgery, patient reports being an established patient with Dr. Gasper Sells. Discussed return precautions and dc with husband.   After the discussed management above, the patient was determined to be safe for discharge.  The patient was in agreement with this plan and all questions regarding their care were answered.  ED return precautions were discussed and the patient will return to the ED with any significant worsening of condition.         Final Clinical Impression(s) / ED Diagnoses Final diagnoses:  Suprapatellar bursitis of right knee    Rx / DC Orders ED Discharge Orders          Ordered    oxyCODONE-acetaminophen (PERCOCET/ROXICET) 5-325 MG tablet  Every 6 hours PRN        09/15/21 0932    senna-docusate (SENOKOT-S) 8.6-50 MG tablet  Daily        09/15/21 0932              Milagros Loll, MD 09/15/21 [id number]

## 2021-09-15 NOTE — ED Notes (Signed)
Pt states "they didn't massage my leg". No tenderness noted, reports "feels weird" after being touched.

## 2021-11-13 ENCOUNTER — Ambulatory Visit
Admission: RE | Admit: 2021-11-13 | Discharge: 2021-11-13 | Disposition: A | Discharge status: Home / Self Care | Payer: Medicare Other | Source: Ambulatory Visit | Attending: Gastroenterology | Admitting: Gastroenterology

## 2021-11-13 ENCOUNTER — Other Ambulatory Visit: Payer: Self-pay | Admitting: Gastroenterology

## 2021-11-13 DIAGNOSIS — K5900 Constipation, unspecified: Secondary | ICD-10-CM

## 2021-11-13 DIAGNOSIS — R109 Unspecified abdominal pain: Secondary | ICD-10-CM

## 2022-01-14 ENCOUNTER — Ambulatory Visit (INDEPENDENT_AMBULATORY_CARE_PROVIDER_SITE_OTHER): Payer: Medicare Other | Admitting: Pulmonary Disease

## 2022-01-14 ENCOUNTER — Encounter: Payer: Self-pay | Admitting: Pulmonary Disease

## 2022-01-14 ENCOUNTER — Telehealth: Payer: Self-pay | Admitting: Pulmonary Disease

## 2022-01-14 VITALS — BP 126/80 | HR 74 | Ht 59.0 in | Wt 127.0 lb

## 2022-01-14 DIAGNOSIS — R053 Chronic cough: Secondary | ICD-10-CM

## 2022-01-14 DIAGNOSIS — J301 Allergic rhinitis due to pollen: Secondary | ICD-10-CM | POA: Diagnosis not present

## 2022-01-14 DIAGNOSIS — R059 Cough, unspecified: Secondary | ICD-10-CM | POA: Diagnosis not present

## 2022-01-14 DIAGNOSIS — U099 Post covid-19 condition, unspecified: Secondary | ICD-10-CM

## 2022-01-14 DIAGNOSIS — R918 Other nonspecific abnormal finding of lung field: Secondary | ICD-10-CM

## 2022-01-14 DIAGNOSIS — K219 Gastro-esophageal reflux disease without esophagitis: Secondary | ICD-10-CM | POA: Diagnosis not present

## 2022-01-14 LAB — POCT EXHALED NITRIC OXIDE: FeNO level (ppb): 26

## 2022-01-14 MED ORDER — FLUTICASONE PROPIONATE 50 MCG/ACT NA SUSP: 2.0000 | Freq: Every day | NASAL | 3 refills | Status: DC

## 2022-01-14 MED ORDER — AZITHROMYCIN 250 MG PO TABS: 250.0000 mg | ORAL_TABLET | Freq: Every day | ORAL | 2 refills | Status: DC

## 2022-01-14 NOTE — Telephone Encounter (Signed)
Patient would like the nurse to call regarding her new medication.  She stated that she was confused at what the pharmacy had for her and would like to discuss with the nurse.  CB# [phone]

## 2022-01-14 NOTE — Patient Instructions (Signed)
Postnasal drip, allergic rhinitis Take Flonase 2 sprays daily no matter how you feel Take over-the-counter generic cetirizine 10 mg daily Keep using the saline sprays  Gastroesophageal reflux disease: Avoid fatty foods, alcohol, chocolate, caffeine and tobacco products No eating within 3 hours of bedtime Pepcid twice a day over-the-counter, use as directed for 1 month  Upper airway cough syndrome, cyclical cough: Exhaled nitric oxide testing Spirometry testing You need to try to suppress your cough to allow your larynx (voice box) to heal.  For three days don't talk, laugh, sing, or clear your throat. Do everything you can to suppress the cough during this time. Use hard candies (sugarless Jolly Ranchers) or non-mint or non-menthol containing cough drops during this time to soothe your throat.  Use a cough suppressant (Delsym or what I have prescribed you) around the clock during this time.  After three days, gradually increase the use of your voice and back off on the cough suppressants.  Abnormal chest exam: High-resolution CT scan of the chest  Follow up in 4 weeks or sooner if needed

## 2022-01-14 NOTE — Progress Notes (Unsigned)
Synopsis: Referred in November 2023 for for chronic cough by Dr. Dulce Sellar.  She had COVID in June 2023 and had a hsotpialziation for the same.  Subjective:   PATIENT ID: Elizabeth Barnett GENDER: female DOB: 01/29/1941, MRN: [id number]   HPI  Chief Complaint  Patient presents with   Consult    Cough: > started in the summer after having COVID > would produce clear phlegm first thing in the mornings > chest discomfort developed later after coughing up mucus > no associated dyspnea > "pulmonary test" was normal > CXR was normal > no chest symptoms when she had COVID,   Childhood normal, no respiratory problems No breathing problems throughout her whole life  She remains physically active with water aerobics and a trainer, no dyspnea  Then phlegm is worse and cough is worse at night.  She is still producing mucus, both when flat or lying flat.  She feels "nasally" right now.    No heartburn or indigestion.  She had previously taken Dexilant, hasn't been on it in a year.  She smoked 1 pack of cigarettes one time in her life, it was when she was studying for an exam.  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    She has post nasal drip, she's had that before. She never took anything.   She snores, her husband has never witnessed apneas.   Record review: June 2023 Hospital discharge summary reviewed where the patient was seen in the setting of COVID-19 and hyponatremia.  Had previously been treated with molnupiravir but only for a few days.  Past Medical History:  Diagnosis Date   Hypertension      No family history on file.   Social History   Socioeconomic History   Marital status: Married    Spouse name: Not on file   Number of  children: Not on file   Years of education: Not on file   Highest education level: Not on file  Occupational History   Not on file  Tobacco Use   Smoking status: Never   Smokeless tobacco: Never  Substance and Sexual Activity   Alcohol use: Not Currently    Comment: rare   Drug use: No   Sexual activity: Not on file  Other Topics Concern   Not on file  Social History Narrative   Not on file   Social Determinants of Health   Financial Resource Strain: Not on file  Food Insecurity: Not on file  Transportation Needs: Not on file  Physical Activity: Not on file  Stress: Not on file  Social Connections: Not on file  Intimate Partner Violence: Not on file     Allergies  Allergen Reactions   Bee Venom Anaphylaxis   Diclofenac Sodium Anaphylaxis and Hives   Doxycycline Hyclate Diarrhea   Iodinated Contrast Media Hives and Itching   Meloxicam Itching        Molnupiravir Nausea Only and Other (See Comments)    Extreme nausea   Nsaids Anaphylaxis   Voltaren [Diclofenac Sodium] Other (See Comments)    Reaction not recalled   Acyclovir Hives        Denosumab Hives and Other (See Comments)    PROLIA injection (  patient questioning this in 2023)   Lorazepam Anxiety and Other (See Comments)    "Made me feel very uptight"   Neomycin-Polymyxin-Dexameth Other (See Comments)    EYE PAIN    Zoledronic Acid Other (See Comments)    RECLAST injection = Headache     Amlodipine Other (See Comments)    Patient stated she might be intolerant of this (??)   Promethazine Other (See Comments)    Patient FAINTED (patient questioning this in 2023)      Outpatient Medications Prior to Visit  Medication Sig Dispense Refill   atorvastatin (LIPITOR) 20 MG tablet Take 20 mg by mouth at bedtime.     Cholecalciferol (VITAMIN D3) 25 MCG (1000 UT) CAPS Take 1,000 Units by mouth in the morning.     ELIQUIS 2.5 MG TABS tablet Take 2.5 mg by mouth in the morning and at bedtime.     flecainide  (TAMBOCOR) 50 MG tablet Take 50 mg by mouth in the morning and at bedtime.     MAGNESIUM GLUCONATE PO Take 1 tablet by mouth 3 (three) times daily with meals.     polyethylene glycol (MIRALAX / GLYCOLAX) packet Take 17 g by mouth in the morning and at bedtime.     RESTASIS 0.05 % ophthalmic emulsion Place 1 drop into both eyes in the morning and at bedtime.     SYSTANE ULTRA PF 0.4-0.3 % SOLN Place 1 drop into both eyes See admin instructions. Place 1 drop into both eyes in the morning and at bedtime- may also use throughout the day as needed/as directed for dryness     vitamin B-12 (CYANOCOBALAMIN) 1000 MCG tablet Take 1,000 mcg by mouth daily.     Wheat Dextrin (BENEFIBER PO) Take by mouth See admin instructions. Mix 2 tablespoonsful of powder into 4-6 ounces of water and drink every morning and at bedtime     amLODipine (NORVASC) 5 MG tablet Take 1 tablet (5 mg total) by mouth daily. 30 tablet 0   NON FORMULARY Place 1 Application into both eyes See admin instructions. Apply a very warm washcloth to both eyes every morning and afternoon     oxyCODONE-acetaminophen (PERCOCET/ROXICET) 5-325 MG tablet Take 1 tablet by mouth every 6 (six) hours as needed for severe pain. (Patient not taking: Reported on 01/14/2022) 10 tablet 0   senna-docusate (SENOKOT-S) 8.6-50 MG tablet Take 1 tablet by mouth daily. (Patient not taking: Reported on 01/14/2022) 10 tablet 0   No facility-administered medications prior to visit.    Review of Systems  Constitutional:  Negative for chills, fever, malaise/fatigue and weight loss.  HENT:  Positive for congestion. Negative for nosebleeds, sinus pain and sore throat.   Eyes:  Negative for photophobia, pain and discharge.  Respiratory:  Positive for cough. Negative for hemoptysis, sputum production, shortness of breath and wheezing.   Cardiovascular:  Negative for chest pain, palpitations, orthopnea and leg swelling.  Gastrointestinal:  Negative for abdominal pain,  constipation, diarrhea, nausea and vomiting.  Genitourinary:  Negative for dysuria, frequency, hematuria and urgency.  Musculoskeletal:  Negative for back pain, joint pain, myalgias and neck pain.  Skin:  Negative for itching and rash.  Neurological:  Negative for tingling, tremors, sensory change, speech change, focal weakness, seizures, weakness and headaches.  Psychiatric/Behavioral:  Negative for memory loss, substance abuse and suicidal ideas. The patient is not nervous/anxious.       Objective:  Physical Exam   Vitals:   01/14/22 1048  BP: 126/80  Pulse:  74  SpO2: 96%  Weight: 127 lb (57.6 kg)  Height: 4\' 11"  (1.499 m)    Gen: well appearing, no acute distress HENT: NCAT, OP clear, neck supple without masses Eyes: PERRL, EOMi Lymph: no cervical lymphadenopathy PULM: Coarse crackles left base B CV: RRR, no mgr, no JVD GI: BS+, soft, nontender, no hsm Derm: no rash or skin breakdown MSK: normal bulk and tone Neuro: A&Ox4, CN II-XII intact, strength 5/5 in all 4 extremities Psyche: normal mood and affect   CBC    Component Value Date/Time   WBC 6.3 08/05/2021 0322   RBC 4.17 08/05/2021 0322   HGB 11.6 (L) 08/05/2021 0322   HCT 34.6 (L) 08/05/2021 0322   PLT 261 08/05/2021 0322   MCV 83.0 08/05/2021 0322   MCH 27.8 08/05/2021 0322   MCHC 33.5 08/05/2021 0322   RDW 14.6 08/05/2021 0322   LYMPHSABS 0.7 08/03/2021 0816   MONOABS 0.6 08/03/2021 0816   EOSABS 0.0 08/03/2021 0816   BASOSABS 0.0 08/03/2021 0816     Chest imaging: June 2023 portable AP chest x-ray images independently reviewed showing a dual-lead pacemaker, boot-shaped heart, normal pulmonary parenchyma  PFT: 12/2021 FENO 26 ppb  Labs:  Path:  Echo:  Heart Catheterization:  Cardiac stress test: 11/2021 normal     Assessment & Plan:   Post-COVID chronic cough - Plan: CT Chest High Resolution  Cough, unspecified type - Plan: POCT EXHALED NITRIC OXIDE, Spirometry with  graph  Chronic cough  Non-seasonal allergic rhinitis due to pollen  Gastroesophageal reflux disease, unspecified whether esophagitis present  Lung field abnormal finding on examination  Discussion: Elizabeth Barnett presents with a persistent cough in the setting of post nasal drip, history of GERD, recent COVID, and abnormal chest exam.  I explained to her that the causes of chronic cough are many including postnasal drip, acid reflux, cyclical cough from persistent coughing, asthma, or a pulmonary parenchymal problem.  In her particular case I think postnasal drip, acid reflux, and cyclical cough (upper airway cough syndrome, perpetual laryngeal irritation) are the most likely causes.  However, I was surprised the fact that she had crackles on physical centimeters so I am worried about something like a post COVID pulmonary fibrosis.  At this point we need to try to control the postnasal drip and reflux and encourage her to have a period of voice rest to allow her vocal cords to heal up.  But we need to test for asthma and look for evidence of underlying pulmonary parenchymal disease.  Plan: Postnasal drip, allergic rhinitis Take Flonase 2 sprays daily no matter how you feel Take over-the-counter generic cetirizine 10 mg daily Keep using the saline sprays  Gastroesophageal reflux disease: Avoid fatty foods, alcohol, chocolate, caffeine and tobacco products No eating within 3 hours of bedtime Pepcid twice a day over-the-counter, use as directed for 1 month  Upper airway cough syndrome, cyclical cough: Exhaled nitric oxide testing Spirometry testing You need to try to suppress your cough to allow your larynx (voice box) to heal.  For three days don't talk, laugh, sing, or clear your throat. Do everything you can to suppress the cough during this time. Use hard candies (sugarless Jolly Ranchers) or non-mint or non-menthol containing cough drops during this time to soothe your throat.  Use a cough  suppressant (Delsym or what I have prescribed you) around the clock during this time.  After three days, gradually increase the use of your voice and back off on the cough suppressants.  Abnormal chest exam: High-resolution CT scan of the chest  Follow up in 4 weeks or sooner if needed  Immunizations: Immunization History  Administered Date(s) Administered   Rabies, IM 10/18/2013, 10/21/2013, 10/25/2013   Tdap 10/18/2013, 03/16/2021     Current Outpatient Medications:    atorvastatin (LIPITOR) 20 MG tablet, Take 20 mg by mouth at bedtime., Disp: , Rfl:    Cholecalciferol (VITAMIN D3) 25 MCG (1000 UT) CAPS, Take 1,000 Units by mouth in the morning., Disp: , Rfl:    ELIQUIS 2.5 MG TABS tablet, Take 2.5 mg by mouth in the morning and at bedtime., Disp: , Rfl:    flecainide (TAMBOCOR) 50 MG tablet, Take 50 mg by mouth in the morning and at bedtime., Disp: , Rfl:    MAGNESIUM GLUCONATE PO, Take 1 tablet by mouth 3 (three) times daily with meals., Disp: , Rfl:    polyethylene glycol (MIRALAX / GLYCOLAX) packet, Take 17 g by mouth in the morning and at bedtime., Disp: , Rfl:    RESTASIS 0.05 % ophthalmic emulsion, Place 1 drop into both eyes in the morning and at bedtime., Disp: , Rfl:    SYSTANE ULTRA PF 0.4-0.3 % SOLN, Place 1 drop into both eyes See admin instructions. Place 1 drop into both eyes in the morning and at bedtime- may also use throughout the day as needed/as directed for dryness, Disp: , Rfl:    vitamin B-12 (CYANOCOBALAMIN) 1000 MCG tablet, Take 1,000 mcg by mouth daily., Disp: , Rfl:    Wheat Dextrin (BENEFIBER PO), Take by mouth See admin instructions. Mix 2 tablespoonsful of powder into 4-6 ounces of water and drink every morning and at bedtime, Disp: , Rfl:    amLODipine (NORVASC) 5 MG tablet, Take 1 tablet (5 mg total) by mouth daily., Disp: 30 tablet, Rfl: 0   NON FORMULARY, Place 1 Application into both eyes See admin instructions. Apply a very warm washcloth to both eyes  every morning and afternoon, Disp: , Rfl:    oxyCODONE-acetaminophen (PERCOCET/ROXICET) 5-325 MG tablet, Take 1 tablet by mouth every 6 (six) hours as needed for severe pain. (Patient not taking: Reported on 01/14/2022), Disp: 10 tablet, Rfl: 0   senna-docusate (SENOKOT-S) 8.6-50 MG tablet, Take 1 tablet by mouth daily. (Patient not taking: Reported on 01/14/2022), Disp: 10 tablet, Rfl: 0

## 2022-01-14 NOTE — Telephone Encounter (Signed)
Spoke with pt and she's concerned that she picked up azithromycin 250 MG and said Dr Army Melia did not mention anything about her taking an antibiotic and wants to make sure this is correct. Also I sent in Flonase to the pt's pharmacy. And will send message to Dr Army Melia about azithromycin 250 MG.

## 2022-01-15 ENCOUNTER — Encounter: Payer: Self-pay | Admitting: Pulmonary Disease

## 2022-01-15 NOTE — Telephone Encounter (Signed)
Called pt left detailed message about azithromycin 250 MG I informed pt it was in error and Dr Kendrick Fries confirmed it was an error. Nothing further needed.

## 2022-01-15 NOTE — Telephone Encounter (Signed)
Dr. Lorrin Goodell, please advise of pepcid strength? Do you recommend 10 or 20mg ?

## 2022-01-15 NOTE — Addendum Note (Signed)
Addended by: Hedda Slade on: 01/15/2022 10:35 AM   Modules accepted: Orders

## 2022-01-17 ENCOUNTER — Telehealth: Payer: Self-pay | Admitting: Pulmonary Disease

## 2022-01-17 MED ORDER — BENZONATATE 200 MG PO CAPS: 200.0000 mg | ORAL_CAPSULE | Freq: Three times a day (TID) | ORAL | 1 refills | Status: DC | PRN

## 2022-01-17 NOTE — Telephone Encounter (Signed)
Can use Delsym 2 teaspoons twice daily as needed for cough.  Can call in Tessalon Perles 200 mg 3 times daily as needed for cough. Flonase 2 puffs daily as needed for nasal congestion Claritin 10 mg daily as needed for sinus drainage If symptoms or not improving will need to be seen in the office for further evaluation and treatment options

## 2022-01-17 NOTE — Telephone Encounter (Signed)
Spoke with pt who states she has all the other meds except Tessalon pearls which were called in for her. Tessalon order placed. Nothing further needed at this time.

## 2022-01-17 NOTE — Telephone Encounter (Signed)
Spoke with pt and informed her HR CT do not require dye for completion.   Pt also states increased dry cough since Tuesday and runny nose. Pt denies fever/ chills/ GI upset. Pt feels if she has a cold. Pt has been using Delsym but it has not helped. Tammy can you please advise as Dr. Kendrick Fries is not available? Thank you

## 2022-01-20 ENCOUNTER — Ambulatory Visit (HOSPITAL_BASED_OUTPATIENT_CLINIC_OR_DEPARTMENT_OTHER)
Admission: RE | Admit: 2022-01-20 | Discharge: 2022-01-20 | Disposition: A | Discharge status: Home / Self Care | Payer: Medicare Other | Source: Ambulatory Visit | Attending: Pulmonary Disease | Admitting: Pulmonary Disease

## 2022-01-20 DIAGNOSIS — U099 Post covid-19 condition, unspecified: Secondary | ICD-10-CM | POA: Insufficient documentation

## 2022-01-20 DIAGNOSIS — R053 Chronic cough: Secondary | ICD-10-CM | POA: Insufficient documentation

## 2022-01-21 ENCOUNTER — Observation Stay (HOSPITAL_BASED_OUTPATIENT_CLINIC_OR_DEPARTMENT_OTHER)
Admission: EM | Admit: 2022-01-21 | Discharge: 2022-01-22 | Disposition: A | Discharge status: Home / Self Care | Payer: Medicare Other | Attending: Internal Medicine | Admitting: Internal Medicine

## 2022-01-21 ENCOUNTER — Emergency Department (HOSPITAL_BASED_OUTPATIENT_CLINIC_OR_DEPARTMENT_OTHER): Payer: Medicare Other

## 2022-01-21 ENCOUNTER — Encounter (HOSPITAL_COMMUNITY): Payer: Self-pay

## 2022-01-21 ENCOUNTER — Other Ambulatory Visit: Payer: Self-pay

## 2022-01-21 ENCOUNTER — Encounter (HOSPITAL_BASED_OUTPATIENT_CLINIC_OR_DEPARTMENT_OTHER): Payer: Self-pay | Admitting: Emergency Medicine

## 2022-01-21 DIAGNOSIS — N390 Urinary tract infection, site not specified: Secondary | ICD-10-CM | POA: Insufficient documentation

## 2022-01-21 DIAGNOSIS — Z950 Presence of cardiac pacemaker: Secondary | ICD-10-CM | POA: Insufficient documentation

## 2022-01-21 DIAGNOSIS — Z79899 Other long term (current) drug therapy: Secondary | ICD-10-CM | POA: Diagnosis not present

## 2022-01-21 DIAGNOSIS — I160 Hypertensive urgency: Secondary | ICD-10-CM

## 2022-01-21 DIAGNOSIS — I10 Essential (primary) hypertension: Secondary | ICD-10-CM | POA: Diagnosis not present

## 2022-01-21 DIAGNOSIS — H538 Other visual disturbances: Secondary | ICD-10-CM | POA: Diagnosis present

## 2022-01-21 DIAGNOSIS — I480 Paroxysmal atrial fibrillation: Secondary | ICD-10-CM | POA: Diagnosis not present

## 2022-01-21 DIAGNOSIS — E785 Hyperlipidemia, unspecified: Secondary | ICD-10-CM | POA: Diagnosis present

## 2022-01-21 DIAGNOSIS — R053 Chronic cough: Secondary | ICD-10-CM | POA: Diagnosis present

## 2022-01-21 DIAGNOSIS — Z66 Do not resuscitate: Secondary | ICD-10-CM | POA: Diagnosis present

## 2022-01-21 DIAGNOSIS — F411 Generalized anxiety disorder: Secondary | ICD-10-CM | POA: Diagnosis present

## 2022-01-21 DIAGNOSIS — R2689 Other abnormalities of gait and mobility: Secondary | ICD-10-CM | POA: Insufficient documentation

## 2022-01-21 DIAGNOSIS — I169 Hypertensive crisis, unspecified: Secondary | ICD-10-CM | POA: Diagnosis not present

## 2022-01-21 DIAGNOSIS — Z7901 Long term (current) use of anticoagulants: Secondary | ICD-10-CM | POA: Diagnosis not present

## 2022-01-21 HISTORY — DX: Presence of cardiac pacemaker: Z95.0

## 2022-01-21 HISTORY — DX: Chronic cough: R05.3

## 2022-01-21 HISTORY — DX: Constipation, unspecified: K59.00

## 2022-01-21 HISTORY — DX: Paroxysmal atrial fibrillation: I48.0

## 2022-01-21 LAB — BASIC METABOLIC PANEL
Anion gap: 10 (ref 5–15)
BUN: 19 mg/dL (ref 8–23)
CO2: 23 mmol/L (ref 22–32)
Calcium: 9.5 mg/dL (ref 8.9–10.3)
Chloride: 98 mmol/L (ref 98–111)
Creatinine, Ser: 0.84 mg/dL (ref 0.44–1.00)
GFR, Estimated: >60 mL/min (ref >60)
Glucose, Bld: 112 mg/dL — ABNORMAL HIGH (ref 70–99)
Potassium: 4 mmol/L (ref 3.5–5.1)
Sodium: 131 mmol/L — ABNORMAL LOW (ref 135–145)

## 2022-01-21 LAB — URINALYSIS, ROUTINE W REFLEX MICROSCOPIC
Bilirubin Urine: NEGATIVE
Glucose, UA: NEGATIVE mg/dL
Ketones, ur: NEGATIVE mg/dL
Nitrite: NEGATIVE
Protein, ur: NEGATIVE mg/dL
Specific Gravity, Urine: 1.015 (ref 1.005–1.030)
pH: 7.5 (ref 5.0–8.0)

## 2022-01-21 LAB — CBC
HCT: 38.2 % (ref 36.0–46.0)
Hemoglobin: 12.8 g/dL (ref 12.0–15.0)
MCH: 27.8 pg (ref 26.0–34.0)
MCHC: 33.5 g/dL (ref 30.0–36.0)
MCV: 82.9 fL (ref 80.0–100.0)
Platelets: 304 10*3/uL (ref 150–400)
RBC: 4.61 MIL/uL (ref 3.87–5.11)
RDW: 14.5 % (ref 11.5–15.5)
WBC: 10 10*3/uL (ref 4.0–10.5)
nRBC: 0 % (ref 0.0–0.2)

## 2022-01-21 LAB — URINALYSIS, MICROSCOPIC (REFLEX)

## 2022-01-21 MED ORDER — CLONIDINE HCL 0.1 MG PO TABS
0.1000 mg | ORAL_TABLET | Freq: Once | ORAL | Status: AC
  Administered 2022-01-21: 0.1 mg via ORAL
  Filled 2022-01-21: qty 1

## 2022-01-21 MED ORDER — CEPHALEXIN 500 MG PO CAPS
500.0000 mg | ORAL_CAPSULE | Freq: Two times a day (BID) | ORAL | Status: DC
  Administered 2022-01-22: 500 mg via ORAL
  Filled 2022-01-21: qty 1
  Filled 2022-01-21: qty 2

## 2022-01-21 MED ORDER — LACTATED RINGERS IV BOLUS
1000.0000 mL | Freq: Once | INTRAVENOUS | Status: AC
  Administered 2022-01-21: 1000 mL via INTRAVENOUS

## 2022-01-21 NOTE — ED Provider Notes (Signed)
[facility] EMERGENCY DEPARTMENT Provider Note   CSN: [id number] Arrival date & time: 01/21/22  1524     History  Chief Complaint  Patient presents with   Hypertension    Elizabeth Barnett is a 81 y.o. female.  HPI 81 year old female presents with hypertension. She has normally had well controlled blood pressure. She was taken off HCTZ a few months ago due to hyponatremia.  She has been dealing with a cough for quite some time though recently did have a worsening of it that was attributed to moderate cold.  She had COVID back in June.  However over the last for 5 days she has been checking her blood pressure and it has been steadily increasing.  She has been compliant with her meds.  She did recently see a pulmonologist and had a high-resolution CT scan.  She was told to start taking Flonase, cetirizine, Delsym, and Pepcid.  She was also on [employer] though stopped this yesterday.  She is worried about medications causing her blood pressure to go up.  She has a little bit of headache that has started today that is a 2 out of 10.  She feels like she has a little bit of blurry vision since yesterday but no double vision.  No focal weakness or numbness, though she does say that when she walks her feet feel "funny". She has noticed this since the high blood pressure started. No confusion. She also had a couple episodes of urinary incontinence.  Home Medications Prior to Admission medications   Medication Sig Start Date End Date Taking? Authorizing Provider  amLODipine (NORVASC) 5 MG tablet Take 1 tablet (5 mg total) by mouth daily. 08/06/21 09/05/21  Jerald Kief, MD  atorvastatin (LIPITOR) 20 MG tablet Take 20 mg by mouth at bedtime.    [provider]  benzonatate (TESSALON) 200 MG capsule Take 1 capsule (200 mg total) by mouth 3 (three) times daily as needed for cough. 01/17/22   Parrett, Virgel Bouquet, NP  Cholecalciferol (VITAMIN D3) 25 MCG (1000 UT) CAPS Take 1,000 Units  by mouth in the morning.    [provider]  ELIQUIS 2.5 MG TABS tablet Take 2.5 mg by mouth in the morning and at bedtime.    [provider]  flecainide (TAMBOCOR) 50 MG tablet Take 50 mg by mouth in the morning and at bedtime. 07/31/21   [provider]  fluticasone (FLONASE) 50 MCG/ACT nasal spray Place 2 sprays into both nostrils daily. 01/14/22   Lupita Leash, MD  MAGNESIUM GLUCONATE PO Take 1 tablet by mouth 3 (three) times daily with meals.    [provider]  NON FORMULARY Place 1 Application into both eyes See admin instructions. Apply a very warm washcloth to both eyes every morning and afternoon    [provider]  oxyCODONE-acetaminophen (PERCOCET/ROXICET) 5-325 MG tablet Take 1 tablet by mouth every 6 (six) hours as needed for severe pain. Patient not taking: Reported on 01/14/2022 09/15/21   Milagros Loll, MD  polyethylene glycol [facility] / Ethelene Hal) packet Take 17 g by mouth in the morning and at bedtime.    [provider]  RESTASIS 0.05 % ophthalmic emulsion Place 1 drop into both eyes in the morning and at bedtime.    [provider]  senna-docusate (SENOKOT-S) 8.6-50 MG tablet Take 1 tablet by mouth daily. Patient not taking: Reported on 01/14/2022 09/15/21   Milagros Loll, MD  SYSTANE ULTRA PF  0.4-0.3 % SOLN Place 1 drop into both eyes See admin instructions. Place 1 drop into both eyes in the morning and at bedtime- may also use throughout the day as needed/as directed for dryness    [provider]  vitamin B-12 (CYANOCOBALAMIN) 1000 MCG tablet Take 1,000 mcg by mouth daily.    [provider]  Wheat Dextrin (BENEFIBER PO) Take by mouth See admin instructions. Mix 2 tablespoonsful of powder into 4-6 ounces of water and drink every morning and at bedtime    [provider]      Allergies    Bee venom, Diclofenac sodium, Doxycycline hyclate, Iodinated contrast media,  Meloxicam, Molnupiravir, Nsaids, Voltaren [diclofenac sodium], Acyclovir, Denosumab, Lorazepam, Neomycin-polymyxin-dexameth, Zoledronic acid, Amlodipine, Buspar [buspirone], Lexapro [escitalopram], and Promethazine    Review of Systems   Review of Systems  Eyes:  Positive for visual disturbance.  Respiratory:  Positive for cough. Negative for shortness of breath.   Cardiovascular:  Positive for chest pain.  Neurological:  Positive for headaches. Negative for weakness and numbness.    Physical Exam Updated Vital Signs BP (!) 182/70 (BP Location: Right Arm)   Pulse (!) 56   Temp 98.3 F (36.8 C) (Oral)   Resp 18   Ht 4\' 11"  (1.499 m)   Wt 57.6 kg   SpO2 96%   BMI 25.65 kg/m  Physical Exam Vitals and nursing note reviewed.  Constitutional:      Appearance: She is well-developed.  HENT:     Head: Normocephalic and atraumatic.  Eyes:     Extraocular Movements: Extraocular movements intact.     Pupils: Pupils are equal, round, and reactive to light.  Cardiovascular:     Rate and Rhythm: Normal rate and regular rhythm.     Heart sounds: Normal heart sounds.  Pulmonary:     Effort: Pulmonary effort is normal.     Breath sounds: Normal breath sounds.  Abdominal:     Palpations: Abdomen is soft.     Tenderness: There is no abdominal tenderness.  Skin:    General: Skin is warm and dry.  Neurological:     Mental Status: She is alert.     Comments: CN 3-12 grossly intact. 5/5 strength in all 4 extremities. Grossly normal sensation. Normal finger to nose.      ED Results / Procedures / Treatments   Labs (all labs ordered are listed, but only abnormal results are displayed) Labs Reviewed  BASIC METABOLIC PANEL - Abnormal; Notable for the following components:      Result Value   Sodium 131 (*)    Glucose, Bld 112 (*)    All other components within normal limits  URINALYSIS, ROUTINE W REFLEX MICROSCOPIC - Abnormal; Notable for the following components:   Color, Urine STRAW  (*)    APPearance HAZY (*)    Hgb urine dipstick TRACE (*)    Leukocytes,Ua MODERATE (*)    All other components within normal limits  URINALYSIS, MICROSCOPIC (REFLEX) - Abnormal; Notable for the following components:   Bacteria, UA RARE (*)    All other components within normal limits  URINE CULTURE  CBC    EKG EKG Interpretation  Date/Time:  Tuesday January 21 2022 16:02:28 EST Ventricular Rate:  66 PR Interval:    QRS Duration: 158 QT Interval:  462 QTC Calculation: 484 R Axis:   -53 Text Interpretation: Atrial-paced rhythm Right bundle branch block Left anterior fascicular block  no significant change since Jun 2023 Confirmed by Jul 2023,  Judy Goodenow [phone]) on 01/21/2022 4:05:32 PM  Radiology CT Head Wo Contrast  Result Date: 01/21/2022 CLINICAL DATA:  Headache EXAM: CT HEAD WITHOUT CONTRAST TECHNIQUE: Contiguous axial images were obtained from the base of the skull through the vertex without intravenous contrast. RADIATION DOSE REDUCTION: This exam was performed according to the departmental dose-optimization program which includes automated exposure control, adjustment of the mA and/or kV according to patient size and/or use of iterative reconstruction technique. COMPARISON:  03/16/2021 FINDINGS: Brain: No acute intracranial abnormality. Specifically, no hemorrhage, hydrocephalus, mass lesion, acute infarction, or significant intracranial injury. Vascular: No hyperdense vessel or unexpected calcification. Skull: No acute calvarial abnormality. Sinuses/Orbits: Mucosal thickening in the maxillary sinuses. No air-fluid levels. Other: None IMPRESSION: No acute intracranial abnormality. Electronically Signed   By: Charlett Nose M.D.   On: 01/21/2022 19:17   CT Chest High Resolution  Result Date: 01/21/2022 CLINICAL DATA:  Post COVID cough. EXAM: CT CHEST WITHOUT CONTRAST TECHNIQUE: Multidetector CT imaging of the chest was performed following the standard protocol without intravenous  contrast. High resolution imaging of the lungs, as well as inspiratory and expiratory imaging, was performed. RADIATION DOSE REDUCTION: This exam was performed according to the departmental dose-optimization program which includes automated exposure control, adjustment of the mA and/or kV according to patient size and/or use of iterative reconstruction technique. COMPARISON:  06/06/2010. FINDINGS: Cardiovascular: Atherosclerotic calcification of the aorta, aortic valve and coronary arteries. Heart is enlarged. No pericardial effusion. Mediastinum/Nodes: No pathologically enlarged mediastinal or axillary lymph nodes. Hilar regions are difficult to definitively evaluate without IV contrast. Esophagus is grossly unremarkable. Lungs/Pleura: Negative for subpleural reticulation, traction bronchiectasis/bronchiolectasis, ground-glass, architectural distortion or honeycombing. Mild bibasilar scarring. Minimal mucoid impaction in the right lower lobe. No pleural fluid. Airway is unremarkable. No air trapping. Upper Abdomen: Liver margin is slightly irregular. Visualized portions of the liver, adrenal glands, right kidney, spleen and stomach are otherwise grossly unremarkable. No upper abdominal adenopathy. Musculoskeletal: Degenerative changes in the spine. Slight anterolisthesis of C7 on T1 and T1 on T2, degenerative in etiology. No worrisome lytic or sclerotic lesions. IMPRESSION: 1. No evidence of interstitial lung disease. No findings to explain the patient's cough. 2. Liver margin appears slightly irregular, raising suspicion for cirrhosis. 3. Aortic atherosclerosis (ICD10-I70.0). Coronary artery calcification. Electronically Signed   By: Leanna Battles M.D.   On: 01/21/2022 14:24    Procedures Procedures    Medications Ordered in ED Medications  cloNIDine (CATAPRES) tablet 0.1 mg (0.1 mg Oral Given 01/21/22 1847)  lactated ringers bolus 1,000 mL ( Intravenous Stopped 01/21/22 2154)    ED Course/ Medical  Decision Making/ A&P                           Medical Decision Making Amount and/or Complexity of Data Reviewed Labs: ordered.    Details: Slight hyponatremia but not clinically significant.  No other significant electrolyte disturbance.  Urinalysis with moderate WBCs, could be UTI in the right setting. Radiology: ordered and independent interpretation performed.    Details: No head bleed on CT head.  Risk Prescription drug management. Decision regarding hospitalization.   Patient presents with significant hypertension and some nonspecific symptoms.  No focal neurodeficits though she is complaining of what sounds like neuropathy to both lower feet that is acute.  In combination with other things such as visual complaints and headache as well as transient urinary incontinence, I considered that this might be CNS in origin.  She is not altered  and does not appear to need an LP.  However I think she will need MRI.  She has received MRIs since receiving her pacemaker though obviously she will need a rep present.  I discussed case with neuro, Dr. Derry LoryKhaliqdina, who recommends MRI to rule out PRES. otherwise does not need to see neuro unless the MRI is positive.  Upon further talking to patient, it sounds like the urine incontinence was brief and then she could hold it and make it to bathroom.  With her abnormal urinalysis this could be from a urinary tract infection so she will be put on oral antibiotics.  She was given a small dose of clonidine as she has multiple medication intolerances and side effects.  This did significantly drop her blood pressure after some time and a little bit of fluids it has now come back up.  I do not think she needs an IV drip at this time.  She is not altered.  Case discussed with Dr. Arlean HoppingHowerter who will admit.        Final Clinical Impression(s) / ED Diagnoses Final diagnoses:  Hypertensive urgency  Acute urinary tract infection    Rx / DC Orders ED Discharge  Orders     None         Pricilla LovelessGoldston, Rishard Delange, MD 01/21/22 2321

## 2022-01-21 NOTE — ED Triage Notes (Signed)
Pt complains of high blood pressure for several days. Pt has been taking medication for a cough and noticed the elevated blood pressure since. Pt also states 2 episodes of urinary incontinence last during during sleep. Pt states at home covid test was negative.

## 2022-01-22 ENCOUNTER — Observation Stay (HOSPITAL_COMMUNITY): Payer: Medicare Other

## 2022-01-22 ENCOUNTER — Encounter (HOSPITAL_COMMUNITY): Payer: Self-pay | Admitting: Internal Medicine

## 2022-01-22 DIAGNOSIS — Z66 Do not resuscitate: Secondary | ICD-10-CM | POA: Diagnosis present

## 2022-01-22 DIAGNOSIS — R053 Chronic cough: Secondary | ICD-10-CM | POA: Diagnosis present

## 2022-01-22 DIAGNOSIS — I169 Hypertensive crisis, unspecified: Secondary | ICD-10-CM | POA: Diagnosis not present

## 2022-01-22 DIAGNOSIS — H538 Other visual disturbances: Secondary | ICD-10-CM | POA: Diagnosis not present

## 2022-01-22 LAB — HEMOGLOBIN A1C
Hgb A1c MFr Bld: 6.1 % — ABNORMAL HIGH (ref 4.8–5.6)
Mean Plasma Glucose: 128 mg/dL

## 2022-01-22 MED ORDER — LOSARTAN POTASSIUM 50 MG PO TABS
100.0000 mg | ORAL_TABLET | Freq: Every day | ORAL | Status: DC
  Filled 2022-01-22: qty 2

## 2022-01-22 MED ORDER — HYDRALAZINE HCL 25 MG PO TABS: 25.0000 mg | ORAL_TABLET | Freq: Four times a day (QID) | ORAL | Status: DC | PRN

## 2022-01-22 MED ORDER — POLYETHYLENE GLYCOL 3350 17 G PO PACK
17.0000 g | PACK | Freq: Two times a day (BID) | ORAL | Status: DC
  Filled 2022-01-22: qty 1

## 2022-01-22 MED ORDER — FLECAINIDE ACETATE 50 MG PO TABS
50.0000 mg | ORAL_TABLET | Freq: Two times a day (BID) | ORAL | Status: DC
  Filled 2022-01-22 (×2): qty 1

## 2022-01-22 MED ORDER — CEPHALEXIN 500 MG PO CAPS: 500.0000 mg | ORAL_CAPSULE | Freq: Two times a day (BID) | ORAL | 0 refills | Status: DC

## 2022-01-22 MED ORDER — BENZONATATE 100 MG PO CAPS: 200.0000 mg | ORAL_CAPSULE | Freq: Three times a day (TID) | ORAL | Status: DC | PRN

## 2022-01-22 MED ORDER — HYDRALAZINE HCL 25 MG PO TABS: 25.0000 mg | ORAL_TABLET | Freq: Four times a day (QID) | ORAL | 0 refills | Status: DC | PRN

## 2022-01-22 MED ORDER — FLUTICASONE PROPIONATE 50 MCG/ACT NA SUSP
2.0000 | Freq: Every day | NASAL | Status: DC
  Filled 2022-01-22: qty 16

## 2022-01-22 MED ORDER — CYCLOSPORINE 0.05 % OP EMUL
1.0000 [drp] | Freq: Two times a day (BID) | OPHTHALMIC | Status: DC
  Filled 2022-01-22: qty 30

## 2022-01-22 MED ORDER — FAMOTIDINE 20 MG PO TABS
20.0000 mg | ORAL_TABLET | Freq: Two times a day (BID) | ORAL | Status: DC
  Administered 2022-01-22: 20 mg via ORAL
  Filled 2022-01-22 (×2): qty 1

## 2022-01-22 MED ORDER — POLYVINYL ALCOHOL 1.4 % OP SOLN
1.0000 [drp] | Freq: Two times a day (BID) | OPHTHALMIC | Status: DC
  Filled 2022-01-22: qty 15

## 2022-01-22 MED ORDER — ACETAMINOPHEN 650 MG RE SUPP: 650.0000 mg | RECTAL | Status: DC | PRN

## 2022-01-22 MED ORDER — ATORVASTATIN CALCIUM 10 MG PO TABS
20.0000 mg | ORAL_TABLET | Freq: Every day | ORAL | Status: DC
  Filled 2022-01-22: qty 2

## 2022-01-22 MED ORDER — ACETAMINOPHEN 325 MG PO TABS: 650.0000 mg | ORAL_TABLET | ORAL | Status: DC | PRN

## 2022-01-22 MED ORDER — ACETAMINOPHEN 160 MG/5ML PO SOLN: 650.0000 mg | ORAL | Status: DC | PRN

## 2022-01-22 MED ORDER — APIXABAN 2.5 MG PO TABS
2.5000 mg | ORAL_TABLET | Freq: Two times a day (BID) | ORAL | Status: DC
  Filled 2022-01-22 (×2): qty 1

## 2022-01-22 MED ORDER — CEPHALEXIN 500 MG PO CAPS
500.0000 mg | ORAL_CAPSULE | Freq: Two times a day (BID) | ORAL | Status: DC
  Filled 2022-01-22: qty 1

## 2022-01-22 NOTE — Care Management Obs Status (Signed)
MEDICARE OBSERVATION STATUS NOTIFICATION   Patient Details  Name: Elizabeth Barnett MRN: [id number] Date of Birth: [birth year]   Medicare Observation Status Notification Given:  Yes    Oletta Cohn, RN 01/22/2022, 5:59 PM

## 2022-01-22 NOTE — Progress Notes (Signed)
Hospitalist Transfer Note:  Transferring facility: [facility] Requesting provider: Dr. Criss Alvine (EDP at [facility]) Reason for transfer: admission for further evaluation and management of hypertensive crisis.     81 y.o. female w/ HTN, paroxysmal atrial fibrillation complicated by sick sinus syndrome status post pacemaker, chronically anticoagulated on Eliquis, who presented to [facility] ED complaining of blurry vision involving bilateral eyes starting on the evening of 01/20/2022; she also reports paresthesias bilaterally, of unclear duration, as well as new onset of urinary incontinence over the last day.  No evidence of objective acute focal neurologic deficit on exam and [facility].   Additionally, the patient conveys that her blood pressure has been running, noting recent modifications to several of her antihypertensive medications at home.  Specifically, she reportedly underwent recent discontinuation of HCTZ as well as Norvasc.  Outpatient hypertensive regimen includes Cozaar.   Upon presenting [facility], stop blood pressure was noted to be greater than 220.  She received a dose of oral clonidine, which decreased her blood pressures into the lower 100s, with ensuing increase in systolic blood pressures into the 170s.  Labs were notable for urinalysis that showed 11-20 white blood cells, for which she was started on Keflex.  Imaging notable for CT head which showed no evidence of acute process.  [facility] EDP has d/w on-call neurology (Dr. Derry Lory) who recommends MRI to eval for PRES, and will consult at [facility]; of note, MRI brain has been ordered, however, patient has pacemaker and will requirement assistance pacer rep.    Subsequently, I accepted this patient for transfer for inpatient admission to a pcu bed at [facility] for further work-up and management of the above .      Check [email or web address] for on-call coverage.   Nursing staff, Please call [facility] Admits & Consults System-Wide number on  Amion as soon as patient's arrival, so appropriate admitting provider can evaluate the pt.     Newton Pigg, DO Hospitalist

## 2022-01-22 NOTE — H&P (Signed)
History and Physical    Patient: Elizabeth Barnett DOB: 04/23/1940 DOA: 01/21/2022 DOS: the patient was seen and examined on 01/22/2022 PCP: Cheron SchaumannVelazquez, Gretchen Y., MD  Patient coming from: Home - lives with husband; NOK:  Husband, [phone]   Chief Complaint: Elizabeth Barnett  HPI: Elizabeth Barnett is a 81 y.o. female with medical history significant of HTN, HLD, pacemaker placement, afib on AC, and anxiety presenting with with elevated BP and blurred Barnett after taking cold medication.  She reports that her BP was greater than 200.  She had a sodium deficiency after COVID and was sent to nephrology and he has her checking her BP every day.  She saw pulm last week and he gave her 4 medications - cetirzine, Pepcid, Fluticasone, and Delsym.  She had had a severe cough, diagnosed as upper airway cough syndrome, cyclical cough.  That was on 11/28 and by the end of the week she developed runny nose and congestion over the weekend - which has improved.  Dr. Dulce Sellarutlaw didn't think it was reflux, but has continued Pepcid and taking for a month.  [facility], last dose was this morning.  BP was 126/80 at pulm and it was also good the next day.  Shortly thereafter, her BP increased to 170.  This also happened in the past when she took Lexapro/Buspar and she had to stop those.  The BP scared him and the internet said if it was >200 she should go to the ER.  She is still coughing and coughing up a lot of phlegm.  She had a chest CT Friday, "nothing shows anything."   Yesterday, "my eyes weren't right in the morning but I have trouble with my eyes anyway."  She uses Systane and Restasis.  It feels funny looking through her glasses but ok when she takes them off.  Her feet and legs feel funny - she had problems with that before and it went away.  It is causing her to walk funny.    I also "have a urinary infection."  She has had frequency "at times in my life."  They looked up her  urinary results on the internet.  She did have incontinence a couple of times one night recently, which is unusual for her.    [facility] Dr. Arlean HoppingHowerter:  Blurry Barnett involving bilateral eyes starting on the evening of 01/20/2022; she also reports paresthesias bilaterally, of unclear duration, as well as new onset of urinary incontinence over the last day.  No evidence of objective acute focal neurologic deficit on [facility].    Additionally, the patient conveys that her blood pressure has been running high, noting recent modifications to several of her antihypertensive medications at home.  Specifically, she reportedly underwent recent discontinuation of HCTZ as well as Norvasc.  Outpatient hypertensive regimen includes Cozaar.    Blood pressure greater than 220.  She received a dose of oral clonidine, which decreased her diastolic blood pressures into the lower 100s, with ensuing increase in systolic blood pressures into the 170s.   Labs were notable for urinalysis that showed 11-20 white blood cells, for which she was started on Keflex.   Imaging notable for CT head which showed no evidence of acute process.   [facility] EDP has d/w on-call neurology (Dr. Derry LoryKhaliqdina) who recommends MRI to eval for PRES, and [facility]; of note, MRI brain  has been ordered, however, patient has pacemaker and will requirement assistance pacer rep.      Review of Systems: As mentioned in the history of present illness. All other systems reviewed and are negative. Past Medical History:  Diagnosis Date   Chronic cough    Constipation    Hypertension    Pacemaker    PAF (paroxysmal atrial fibrillation) (HCC)    on Eliquis   Past Surgical History:  Procedure Laterality Date   ANAL RECTAL MANOMETRY N/A 09/11/2017   Procedure: ANO RECTAL MANOMETRY;  Surgeon: Willis Modena, MD;  Location: [facility] ENDOSCOPY;  Service: Endoscopy;  Laterality: N/A;  DR. Bosie Clos TO  READ   PACEMAKER IMPLANT     Social History:  reports that she has never smoked. She has never used smokeless tobacco. She reports that she does not currently use alcohol. She reports that she does not use drugs.  Allergies  Allergen Reactions   Bee Venom Anaphylaxis   Diclofenac Sodium Anaphylaxis and Hives   Doxycycline Hyclate Diarrhea   Iodinated Contrast Media Hives and Itching   Meloxicam Itching        Molnupiravir Nausea Only and Other (See Comments)    Extreme nausea   Nsaids Anaphylaxis   Voltaren [Diclofenac Sodium] Other (See Comments)    Reaction not recalled   Acyclovir Hives        Denosumab Hives and Other (See Comments)    PROLIA injection (patient questioning this in 2023)   Lorazepam Anxiety and Other (See Comments)    "Made me feel very uptight"   Neomycin-Polymyxin-Dexameth Other (See Comments)    EYE PAIN    Zoledronic Acid Other (See Comments)    RECLAST injection = Headache     Amlodipine Other (See Comments)    Patient stated she might be intolerant of this (??)   Buspar [Buspirone] Hypertension   Lexapro [Escitalopram] Hypertension   Promethazine Other (See Comments)    Patient FAINTED (patient questioning this in 2023)     History reviewed. No pertinent family history.  Prior to Admission medications   Medication Sig Start Date End Date Taking? Authorizing Provider  amLODipine (NORVASC) 5 MG tablet Take 1 tablet (5 mg total) by mouth daily. 08/06/21 09/05/21  Jerald Kief, MD  atorvastatin (LIPITOR) 20 MG tablet Take 20 mg by mouth at bedtime.    [provider]  benzonatate (TESSALON) 200 MG capsule Take 1 capsule (200 mg total) by mouth 3 (three) times daily as needed for cough. 01/17/22   Parrett, Virgel Bouquet, NP  Cholecalciferol (VITAMIN D3) 25 MCG (1000 UT) CAPS Take 1,000 Units by mouth in the morning.    [provider]  ELIQUIS 2.5 MG TABS tablet Take 2.5 mg by mouth in the morning and at bedtime.    [provider]  flecainide (TAMBOCOR) 50 MG tablet Take 50 mg by mouth in the morning and at bedtime. 07/31/21   [provider]  fluticasone (FLONASE) 50 MCG/ACT nasal spray Place 2 sprays into both nostrils daily. 01/14/22   Lupita Leash, MD  MAGNESIUM GLUCONATE PO Take 1 tablet by mouth 3 (three) times daily with meals.    [provider]  NON FORMULARY Place 1 Application into both eyes See admin instructions. Apply a very warm washcloth to both eyes every morning and afternoon    [provider]  oxyCODONE-acetaminophen (PERCOCET/ROXICET) 5-325 MG tablet Take 1 tablet by mouth every 6 (six) hours as needed for severe pain. Patient not  taking: Reported on 01/14/2022 09/15/21   Milagros Loll, MD  polyethylene glycol [facility] Ethelene Hal) packet Take 17 g by mouth in the morning and at bedtime.    [provider]  RESTASIS 0.05 % ophthalmic emulsion Place 1 drop into both eyes in the morning and at bedtime.    [provider]  senna-docusate (SENOKOT-S) 8.6-50 MG tablet Take 1 tablet by mouth daily. Patient not taking: Reported on 01/14/2022 09/15/21   Milagros Loll, MD  SYSTANE ULTRA PF 0.4-0.3 % SOLN Place 1 drop into both eyes See admin instructions. Place 1 drop into both eyes in the morning and at bedtime- may also use throughout the day as needed/as directed for dryness    [provider]  vitamin B-12 (CYANOCOBALAMIN) 1000 MCG tablet Take 1,000 mcg by mouth daily.    [provider]  Wheat Dextrin (BENEFIBER PO) Take by mouth See admin instructions. Mix 2 tablespoonsful of powder into 4-6 ounces of water and drink every morning and at bedtime    [provider]    Physical Exam: Vitals:   01/22/22 0815 01/22/22 0923 01/22/22 1200 01/22/22 1217  BP: 135/72 (!) 159/83 129/60 (!) 129/57  Pulse: 63 79    Resp: 17 18 12 14   Temp:  98 F (36.7 C)    TempSrc:  Oral    SpO2: 99% 98%    Weight:      Height:        General:  Appears calm and comfortable and is in NAD, very conversant Eyes:   EOMI, normal lids, iris ENT:  grossly normal hearing, lips & tongue, mmm Neck:  no LAD, masses or thyromegaly Cardiovascular:  RRR, no m/r/g. No LE edema.  Respiratory:   CTA bilaterally with no wheezes/rales/rhonchi.  Normal respiratory effort. Abdomen:  soft, NT, ND Skin:  no rash or induration seen on limited exam Musculoskeletal:  grossly normal tone BUE/BLE, good ROM, no bony abnormality Psychiatric:  grossly normal mood and affect, speech fluent and appropriate, AOx3 Neurologic:  CN 2-12 grossly intact, moves all extremities in coordinated fashion, sensation intact   Radiological Exams on Admission: Independently reviewed - see discussion in A/P where applicable  CT Head Wo Contrast  Result Date: 01/21/2022 CLINICAL DATA:  Headache EXAM: CT HEAD WITHOUT CONTRAST TECHNIQUE: Contiguous axial images were obtained from the base of the skull through the vertex without intravenous contrast. RADIATION DOSE REDUCTION: This exam was performed according to the departmental dose-optimization program which includes automated exposure control, adjustment of the mA and/or kV according to patient size and/or use of iterative reconstruction technique. COMPARISON:  03/16/2021 FINDINGS: Brain: No acute intracranial abnormality. Specifically, no hemorrhage, hydrocephalus, mass lesion, acute infarction, or significant intracranial injury. Vascular: No hyperdense vessel or unexpected calcification. Skull: No acute calvarial abnormality. Sinuses/Orbits: Mucosal thickening in the maxillary sinuses. No air-fluid levels. Other: None IMPRESSION: No acute intracranial abnormality. Electronically Signed   By: 03/18/2021 M.D.   On: 01/21/2022 19:17   CT Chest High Resolution  Result Date: 01/21/2022 CLINICAL DATA:  Post COVID cough. EXAM: CT CHEST WITHOUT CONTRAST TECHNIQUE: Multidetector CT imaging of the chest was performed  following the standard protocol without intravenous contrast. High resolution imaging of the lungs, as well as inspiratory and expiratory imaging, was performed. RADIATION DOSE REDUCTION: This exam was performed according to the departmental dose-optimization program which includes automated exposure control, adjustment of the mA and/or kV according to patient size and/or use of iterative reconstruction technique. COMPARISON:  06/06/2010. FINDINGS: Cardiovascular: Atherosclerotic calcification of the aorta, aortic valve and coronary arteries. Heart is enlarged. No pericardial effusion. Mediastinum/Nodes: No pathologically enlarged mediastinal or axillary lymph nodes. Hilar regions are difficult to definitively evaluate without IV contrast. Esophagus is grossly unremarkable. Lungs/Pleura: Negative for subpleural reticulation, traction bronchiectasis/bronchiolectasis, ground-glass, architectural distortion or honeycombing. Mild bibasilar scarring. Minimal mucoid impaction in the right lower lobe. No pleural fluid. Airway is unremarkable. No air trapping. Upper Abdomen: Liver margin is slightly irregular. Visualized portions of the liver, adrenal glands, right kidney, spleen and stomach are otherwise grossly unremarkable. No upper abdominal adenopathy. Musculoskeletal: Degenerative changes in the spine. Slight anterolisthesis of C7 on T1 and T1 on T2, degenerative in etiology. No worrisome lytic or sclerotic lesions. IMPRESSION: 1. No evidence of interstitial lung disease. No findings to explain the patient's cough. 2. Liver margin appears slightly irregular, raising suspicion for cirrhosis. 3. Aortic atherosclerosis (ICD10-I70.0). Coronary artery calcification. Electronically Signed   By: Leanna Battles M.D.   On: 01/21/2022 14:24    EKG: Independently reviewed.  Atrial-paced with rate 66;  RBBB, LAFB; LVH; nonspecific ST changes with no evidence of acute ischemia   Labs on Admission: I have personally reviewed  the available labs and imaging studies at the time of the admission.  Pertinent labs:    Na++ 131 Glucose 112 Normal CBC UA: trace Hgb, moderate LE, rare bacteria Urine culture pending   Assessment and Plan: Principal Problem:   Hypertensive crisis Active Problems:   PAF (paroxysmal atrial fibrillation) (HCC)   Hyperlipidemia LDL goal <130   Generalized anxiety disorder   S/P placement of cardiac pacemaker   Chronic cough   DNR (do not resuscitate)    Hypertensive crisis -Patient presenting with markedly elevated BP and vague neurologic symptoms in the setting of cough medication concerning for hypertensive crisis -The patient received Clonidine x 1 in the ER with subsequent significant decrease in BP without apparent difficulty -Based on his BP control no longer in a dangerous range, will observe patient on telemetry  -Will order MRI to r/o primary neurologic cause -BP has improved and patient would prefer to hold Delsym and not add new medication, if possible -Will continue Cozaar -Will add prn PO hydralazine for SBP >180 -PT consult - reports periodic tingling of feet with some ataxia -If MRI is negative, she may be able to dc later today  Possible UTI -Vague urinary symptoms - some frequency, a couple of episodes of incontinence -Mildly abnormal UA -Urine culture is pending -Continue Keflex for a total of 3 days  Afib -Rate controlled with flecainide -Has pacemaker -Continue Eliquis  HLD -Continue Lipitor  Chronic cough -Cyclical cough syndrome -Started on Delsym recently, will hold; continue Tessalon as needed -She is also on fluticasone, Pepcid  Anxiety -She does not appear to be taking medications for this issue at this time  -She may benefit from medication, but is leery after prior HTN response to Lexapro and/or buspirone  DNR -I have discussed code status with the patient and her husband and they are in agreement that the patient would not desire  resuscitation and would prefer to die a natural death should that situation arise. -She will need a gold out of facility DNR form at the time of discharge      Advance Care Planning:   Code Status: DNR   Consults: Neurology (telephone only); PT, TOC team  DVT Prophylaxis: Eliquis  Family Communication: Husband was present throughout evaluation  Severity of Illness: The appropriate patient  status for this patient is OBSERVATION. Observation status is judged to be reasonable and necessary in order to provide the required intensity of service to ensure the patient's safety. The patient's presenting symptoms, physical exam findings, and initial radiographic and laboratory data in the context of their medical condition is felt to place them at decreased risk for further clinical deterioration. Furthermore, it is anticipated that the patient will be medically stable for discharge from the hospital within 2 midnights of admission.   Author: Jonah Blue, MD 01/22/2022 1:00 PM  For on call review [email or web address].

## 2022-01-22 NOTE — Evaluation (Signed)
Physical Therapy Evaluation Patient Details Name: Elizabeth Barnett MRN: [id number] DOB: [birth year] Today's Date: 01/22/2022  History of Present Illness  Pt is 81 yo female admitted 01/21/22 with HTN and blurry vision.  Reports feet also feel "funny" has had in past. MRI negative for acute changes.  Pt with hx of pacemaker, afib, HTN  Clinical Impression  Pt admitted with above diagnosis. Pt very active and independent at baseline - does water aerobics and goes to gym weekly.  Today, pt demonstrating equal strength bilaterally and was able to ambulate 300' without AD.  She does move quickly and needs cues to slow down for line/lead management.  Pt does demonstrate decreased dorsiflexion with ambulation -can correct with cues but needs repeated cues for carry over; significant other reports she has had in past.  Pt reports feet feel funny/puffy, sensation intact, state she has had in past.  Pt is motivated and eager to return home. Demonstrates mobility similar to baseline and safe to return home from mobility perspective. Pt could benefit from outpt PT to address gait deficits and decrease fall risk with her fast and at time shuffle gait. Pt has not had falls other than 1 stepping up [place].  Pt currently with functional limitations due to the deficits listed below (see PT Problem List). Pt will benefit from skilled PT to increase their independence and safety with mobility to allow discharge to the venue listed below.          Recommendations for follow up therapy are one component of a multi-disciplinary discharge planning process, led by the attending physician.  Recommendations may be updated based on patient status, additional functional criteria and insurance authorization.  Follow Up Recommendations Outpatient PT      Assistance Recommended at Discharge PRN  Patient can return home with the following       Equipment Recommendations None recommended by PT  Recommendations for Other  Services       Functional Status Assessment Patient has not had a recent decline in their functional status     Precautions / Restrictions Precautions Precautions: None      Mobility  Bed Mobility Overal bed mobility: Needs Assistance Bed Mobility: Supine to Sit, Sit to Supine     Supine to sit: Modified independent (Device/Increase time) Sit to supine: Modified independent (Device/Increase time)        Transfers Overall transfer level: Needs assistance Equipment used: None Transfers: Sit to/from Stand Sit to Stand: Supervision           General transfer comment: supervision for lines    Ambulation/Gait Ambulation/Gait assistance: Supervision Gait Distance (Feet): 400 Feet Assistive device: None Gait Pattern/deviations: Step-through pattern, Decreased dorsiflexion - left, Decreased dorsiflexion - right Gait velocity: fast     General Gait Details: Pt with fast gait - at times too fast (with lines/lead and hospital gown) and required cues to be cautious and slow down.  Also, with decreased dorsiflexion and heel strike bilaterall - could improve with cues for heel strike for ~10 steps but would revert unless cued again  Stairs Stairs: Yes Stairs assistance: Supervision Stair Management: One rail Left, Forwards, Alternating pattern Number of Stairs: 5    Wheelchair Mobility    Modified Rankin (Stroke Patients Only)       Balance Overall balance assessment: Needs assistance   Sitting balance-Leahy Scale: Normal Sitting balance - Comments: donned shoes at EOB   Standing balance support: No upper extremity supported Standing balance-Leahy Scale: Good  Pertinent Vitals/Pain Pain Assessment Pain Assessment: 0-10 Pain Score: 2  Pain Location: headache Pain Descriptors / Indicators: Aching Pain Intervention(s): Limited activity within patient's tolerance, Monitored during session    Home Living  Family/patient expects to be discharged to:: Private residence Living Arrangements: Spouse/significant other Available Help at Discharge: Family;Available 24 hours/day Type of Home: House Home Access: Stairs to enter Entrance Stairs-Rails: Doctor, general practice of Steps: 5   Home Layout: One level;Laundry or work area in [employer] Equipment: Grab bars - Chartered loss adjuster (2 wheels);Cane - single point      Prior Function Prior Level of Function : Independent/Modified Independent;Driving             Mobility Comments: Ambulates without AD; likes to walk outside everyday; does water aerobics 2 days a week and has trainer 2 days a week ADLs Comments: Pt able to do adls and iadls     Hand Dominance        Extremity/Trunk Assessment   Upper Extremity Assessment Upper Extremity Assessment: Overall WFL for tasks assessed (ROM WFL and MMT 5/5 bil)    Lower Extremity Assessment Lower Extremity Assessment: LLE deficits/detail;RLE deficits/detail RLE Deficits / Details: ROM WFL; MMT: 5/5 hip flex, 5/5 knee ext/flex, 4+/5 ankle DF; reports feet feel puffy, does have some edema but reports baseline RLE Sensation: WNL LLE Deficits / Details: ROM WFL; MMT: 5/5 hip flex, 5/5 knee ext/flex, 4+/5 ankle DF; reports feet feel puffy, does have some edema but reports baseline LLE Sensation: WNL    Cervical / Trunk Assessment Cervical / Trunk Assessment: Normal  Communication   Communication: No difficulties  Cognition Arousal/Alertness: Awake/alert Behavior During Therapy: Impulsive (with lines) Overall Cognitive Status: Within Functional Limits for tasks assessed                                          General Comments      Exercises     Assessment/Plan    PT Assessment Patient needs continued PT services  PT Problem List Decreased strength;Cardiopulmonary status limiting activity;Decreased activity tolerance;Decreased balance;Decreased  safety awareness;Decreased mobility       PT Treatment Interventions Therapeutic exercise;Gait training;Balance training;Stair training;Functional mobility training;Therapeutic activities;Patient/family education;Neuromuscular re-education    PT Goals (Current goals can be found in the Care Plan section)  Acute Rehab PT Goals Patient Stated Goal: return home today PT Goal Formulation: With patient/family Time For Goal Achievement: 02/06/22 Potential to Achieve Goals: Good Additional Goals Additional Goal #1: Pt will score >19 on DGI to indicate low fall risk    Frequency Min 3X/week     Co-evaluation               AM-PAC PT "6 Clicks" Mobility  Outcome Measure Help needed turning from your back to your side while in a flat bed without using bedrails?: None Help needed moving from lying on your back to sitting on the side of a flat bed without using bedrails?: None Help needed moving to and from a bed to a chair (including a wheelchair)?: None Help needed standing up from a chair using your arms (e.g., wheelchair or bedside chair)?: None Help needed to walk in hospital room?: A Little Help needed climbing 3-5 steps with a railing? : A Little 6 Click Score: 22    End of Session Equipment Utilized During Treatment: Gait belt Activity Tolerance: Patient tolerated treatment well Patient left:  in bed;with call bell/phone within reach;with bed alarm set Nurse Communication: Mobility status PT Visit Diagnosis: Other abnormalities of gait and mobility (R26.89)    Time: 0569-7948 PT Time Calculation (min) (ACUTE ONLY): 28 min   Charges:   PT Evaluation $PT Eval Low Complexity: 1 Low PT Treatments $Gait Training: 8-22 mins        Anise Salvo, PT Acute Rehab [facility] [phone]   Elizabeth Barnett 01/22/2022, 5:31 PM

## 2022-01-22 NOTE — Progress Notes (Signed)
Patient to MRI from [facility] for MRI brain wo contrast. Patient has [employer] device, followed by [facility]. CLE sent. Orders received for DOO 85, tachy therapies off. Will re-program once scan is completed.

## 2022-01-22 NOTE — Care Management CC44 (Signed)
Condition Code 44 Documentation Completed  Patient Details  Name: Elizabeth Barnett MRN: [id number] Date of Birth: 08-15-1940   Condition Code 44 given:  Yes Patient signature on Condition Code 44 notice:  Yes Documentation of 2 MD's agreement:  Yes Code 44 added to claim:  Yes    Oletta Cohn, RN 01/22/2022, 5:59 PM

## 2022-01-22 NOTE — Progress Notes (Signed)
PT Cancellation Note  Patient Details Name: SHARONNE RICKETTS MRN: [id number] DOB: 1940/10/03   Cancelled Treatment:    Reason Eval/Treat Not Completed: Patient at procedure or test/unavailable Pt just left for MRI.  Will f/u later this afternoon as able. Anise Salvo, PT Acute Rehab [facility] [phone]   Rayetta Humphrey 01/22/2022, 1:38 PM

## 2022-01-22 NOTE — Discharge Summary (Signed)
Physician Discharge Summary   Patient: Elizabeth Barnett MRN: [id number] DOB: 06/11/1940  Admit date:     01/21/2022  Discharge date: 01/22/22  Discharge Physician: Jonah BlueJennifer Reichen Hutzler   PCP: Cheron SchaumannVelazquez, Gretchen Y., MD   Recommendations at discharge:   Complete Keflex for UTI. Take a dose of hydralazine (25 mg) up to every 4 hours as needed for systolic blood pressure >180. If needing more than 5 total doses of hydralazine, make an acute appointment with your PCP.   Follow up with your PCP in 1-2 weeks. Return to the ER with BP >180-200 that does not improve with hydralazine. Continue to take Tessalon as needed for cough; follow up with Dr. Kendrick FriesMcQuaid for additional cough-related concerns.  Discharge Diagnoses: Principal Problem:   Hypertensive crisis Active Problems:   PAF (paroxysmal atrial fibrillation) (HCC)   Hyperlipidemia LDL goal <130   Generalized anxiety disorder   S/P placement of cardiac pacemaker   Chronic cough   DNR (do not resuscitate)  Resolved Problems:   * No resolved hospital problems. *  Hospital Course: Negative MRI, PT recommended outpatient PT.  Patient was stable throughout with improved BP and requested dc to home.  All questions answers (with assistance from RN).   Assessment and Plan:  Hypertensive crisis -Patient presenting with markedly elevated BP and vague neurologic symptoms in the setting of cough medication concerning for hypertensive crisis -The patient received Clonidine x 1 in the ER with subsequent significant decrease in BP without apparent difficulty -Based on BP control no longer in a dangerous range, she was observed on telemetry  -MRI done to r/o primary neurologic cause, no acute disease but there were some chronic findings, can be referred to outpatient neurology if desired -BP has improved and patient would prefer to hold Delsym and not add new medication, if possible -Will continue Cozaar -Will add prn PO hydralazine for SBP >180 -PT  consulted - reports periodic tingling of feet with some ataxia - and recommends home PT, which has been ordered -Patient requested that she be able to dc today -A1c ordered and pending -Outpatient lipid panel done on 11/14 (135/57/59/95)   Possible UTI -Vague urinary symptoms - some frequency, a couple of episodes of incontinence -Mildly abnormal UA -Urine culture is pending -Continue Keflex for a total of 3 days   Afib -Rate controlled with flecainide -Has pacemaker -Continue Eliquis   HLD -Continue Lipitor   Chronic cough -Cyclical cough syndrome -Started on Delsym recently, will hold; continue Tessalon as needed -She is also on fluticasone, Pepcid -F/u with Dr. Kendrick FriesMcQuaid as needed for ongoing symptoms   Anxiety -She does not appear to be taking medications for this issue at this time  -She may benefit from medication, but is leery after prior HTN response to Lexapro and/or buspirone   DNR -I have discussed code status with the patient and her husband and they are in agreement that the patient would not desire resuscitation and would prefer to die a natural death should that situation arise. -Recommend discussion with PCP and having DNR form completed       Consultants: Neurology (telephone only); PT Procedures performed: MRI  Disposition: Home Diet recommendation:  Discharge Diet Orders (From admission, onward)     Start     Ordered   01/22/22 0000  Diet general        01/22/22 1815           Regular diet DISCHARGE MEDICATION: Allergies as of 01/22/2022  Reactions   Bee Venom Anaphylaxis   Diclofenac Sodium Anaphylaxis, Hives   Doxycycline Hyclate Diarrhea   Iodinated Contrast Media Hives, Itching   Meloxicam Itching      Molnupiravir Nausea Only, Other (See Comments)   Extreme nausea   Nsaids Anaphylaxis   Voltaren [diclofenac Sodium] Other (See Comments)   Reaction not recalled   Acyclovir Hives      Denosumab Hives, Other (See Comments)    PROLIA injection (patient questioning this in 2023)   Lorazepam Anxiety, Other (See Comments)   "Made me feel very uptight"   Neomycin-polymyxin-dexameth Other (See Comments)   EYE PAIN   Zoledronic Acid Other (See Comments)   RECLAST injection = Headache   Amlodipine Other (See Comments)   Patient stated she might be intolerant of this (??)   Buspar [buspirone] Hypertension   Lexapro [escitalopram] Hypertension   Promethazine Other (See Comments)   Patient FAINTED (patient questioning this in 2023)        Medication List     TAKE these medications    atorvastatin 20 MG tablet Commonly known as: LIPITOR Take 20 mg by mouth at bedtime.   BENEFIBER PO Take by mouth See admin instructions. Mix 2 tablespoonsful of powder into 4-6 ounces of water and drink every morning and at bedtime   benzonatate 200 MG capsule Commonly known as: TESSALON Take 1 capsule (200 mg total) by mouth 3 (three) times daily as needed for cough.   calcium citrate 950 (200 Ca) MG tablet Commonly known as: CALCITRATE - dosed in mg elemental calcium Take 200 mg of elemental calcium by mouth 2 (two) times daily.   cephALEXin 500 MG capsule Commonly known as: KEFLEX Take 1 capsule (500 mg total) by mouth every 12 (twelve) hours.   cetirizine 10 MG tablet Commonly known as: ZYRTEC Take 10 mg by mouth daily.   cyanocobalamin 1000 MCG tablet Commonly known as: VITAMIN B12 Take 1,000 mcg by mouth daily.   Eliquis 2.5 MG Tabs tablet Generic drug: apixaban Take 2.5 mg by mouth in the morning and at bedtime.   famotidine 20 MG tablet Commonly known as: PEPCID Take 20 mg by mouth 2 (two) times daily.   flecainide 50 MG tablet Commonly known as: TAMBOCOR Take 50 mg by mouth in the morning and at bedtime.   fluticasone 50 MCG/ACT nasal spray Commonly known as: FLONASE Place 2 sprays into both nostrils daily.   hydrALAZINE 25 MG tablet Commonly known as: APRESOLINE Take 1 tablet (25 mg total) by  mouth every 6 (six) hours as needed (SBP >180).   losartan 100 MG tablet Commonly known as: COZAAR Take 100 mg by mouth daily.   MAGNESIUM GLUCONATE PO Take 1 tablet by mouth 3 (three) times daily with meals.   NON FORMULARY Place 1 Application into both eyes See admin instructions. Apply a very warm washcloth to both eyes every morning and afternoon   polyethylene glycol 17 g packet Commonly known as: MIRALAX / GLYCOLAX Take 17 g by mouth in the morning and at bedtime.   Prolia 60 MG/ML Sosy injection Generic drug: denosumab Inject 60 mg into the skin every 6 (six) months.   Restasis 0.05 % ophthalmic emulsion Generic drug: cycloSPORINE Place 1 drop into both eyes in the morning and at bedtime.   Systane Ultra PF 0.4-0.3 % Soln Generic drug: Polyethyl Glyc-Propyl Glyc PF Place 1 drop into both eyes See admin instructions. Place 1 drop into both eyes in the morning and at bedtime-  may also use throughout the day as needed/as directed for dryness   Vitamin D3 25 MCG (1000 UT) Caps Take 1,000 Units by mouth in the morning.        Discharge Exam: Filed Weights   01/21/22 1601  Weight: 57.6 kg     Condition at discharge: stable  The results of significant diagnostics from this hospitalization (including imaging, microbiology, ancillary and laboratory) are listed below for reference.   Imaging Studies: MR BRAIN WO CONTRAST  Result Date: 01/22/2022 CLINICAL DATA:  Headache, neuro deficit EXAM: MRI HEAD WITHOUT CONTRAST TECHNIQUE: Multiplanar, multiecho pulse sequences of the brain and surrounding structures were obtained without intravenous contrast. COMPARISON:  MRI head 02/28/2016. FINDINGS: Brain: No acute infarction, hemorrhage, hydrocephalus, extra-axial collection or mass lesion. Similar scattered T2/FLAIR hyperintensities, including somewhat prominent focus of T2/FLAIR hyperintensity in the right frontal periventricular white matter which is T1 hypointense. Vascular:  Major arterial flow voids are maintained skull base. Skull and upper cervical spine: Normal marrow signal. Sinuses/Orbits: Paranasal sinus mucosal thickening with air-fluid levels in the maxillary sinuses. No acute orbital findings. Other: No mastoid effusions IMPRESSION: 1. No evidence of acute intracranial abnormality. 2. Similar scattered T2/FLAIR hyperintensities, which could be related to chronic microvascular ischemic disease and/or chronic demyelination. Electronically Signed   By: Feliberto Harts M.D.   On: 01/22/2022 15:21   CT Head Wo Contrast  Result Date: 01/21/2022 CLINICAL DATA:  Headache EXAM: CT HEAD WITHOUT CONTRAST TECHNIQUE: Contiguous axial images were obtained from the base of the skull through the vertex without intravenous contrast. RADIATION DOSE REDUCTION: This exam was performed according to the departmental dose-optimization program which includes automated exposure control, adjustment of the mA and/or kV according to patient size and/or use of iterative reconstruction technique. COMPARISON:  03/16/2021 FINDINGS: Brain: No acute intracranial abnormality. Specifically, no hemorrhage, hydrocephalus, mass lesion, acute infarction, or significant intracranial injury. Vascular: No hyperdense vessel or unexpected calcification. Skull: No acute calvarial abnormality. Sinuses/Orbits: Mucosal thickening in the maxillary sinuses. No air-fluid levels. Other: None IMPRESSION: No acute intracranial abnormality. Electronically Signed   By: Charlett Nose M.D.   On: 01/21/2022 19:17   CT Chest High Resolution  Result Date: 01/21/2022 CLINICAL DATA:  Post COVID cough. EXAM: CT CHEST WITHOUT CONTRAST TECHNIQUE: Multidetector CT imaging of the chest was performed following the standard protocol without intravenous contrast. High resolution imaging of the lungs, as well as inspiratory and expiratory imaging, was performed. RADIATION DOSE REDUCTION: This exam was performed according to the  departmental dose-optimization program which includes automated exposure control, adjustment of the mA and/or kV according to patient size and/or use of iterative reconstruction technique. COMPARISON:  06/06/2010. FINDINGS: Cardiovascular: Atherosclerotic calcification of the aorta, aortic valve and coronary arteries. Heart is enlarged. No pericardial effusion. Mediastinum/Nodes: No pathologically enlarged mediastinal or axillary lymph nodes. Hilar regions are difficult to definitively evaluate without IV contrast. Esophagus is grossly unremarkable. Lungs/Pleura: Negative for subpleural reticulation, traction bronchiectasis/bronchiolectasis, ground-glass, architectural distortion or honeycombing. Mild bibasilar scarring. Minimal mucoid impaction in the right lower lobe. No pleural fluid. Airway is unremarkable. No air trapping. Upper Abdomen: Liver margin is slightly irregular. Visualized portions of the liver, adrenal glands, right kidney, spleen and stomach are otherwise grossly unremarkable. No upper abdominal adenopathy. Musculoskeletal: Degenerative changes in the spine. Slight anterolisthesis of C7 on T1 and T1 on T2, degenerative in etiology. No worrisome lytic or sclerotic lesions. IMPRESSION: 1. No evidence of interstitial lung disease. No findings to explain the patient's cough. 2. Liver margin appears slightly  irregular, raising suspicion for cirrhosis. 3. Aortic atherosclerosis (ICD10-I70.0). Coronary artery calcification. Electronically Signed   By: Leanna Battles M.D.   On: 01/21/2022 14:24    Labs: CBC: Recent Labs  Lab 01/21/22 1603  WBC 10.0  HGB 12.8  HCT 38.2  MCV 82.9  PLT 304   Basic Metabolic Panel: Recent Labs  Lab 01/21/22 1603  NA 131*  K 4.0  CL 98  CO2 23  GLUCOSE 112*  BUN 19  CREATININE 0.84  CALCIUM 9.5     Discharge time spent: less than 30 minutes.  Signed: Jonah Blue, MD [facility] 01/22/2022

## 2022-01-23 LAB — URINE CULTURE

## 2022-01-24 ENCOUNTER — Other Ambulatory Visit (HOSPITAL_COMMUNITY): Payer: Self-pay | Admitting: Gastroenterology

## 2022-01-24 DIAGNOSIS — R932 Abnormal findings on diagnostic imaging of liver and biliary tract: Secondary | ICD-10-CM

## 2022-01-27 ENCOUNTER — Telehealth: Payer: Self-pay | Admitting: Pulmonary Disease

## 2022-01-27 NOTE — Telephone Encounter (Signed)
Spoke with the pt  She states that her BP has been ranging from 166/83-141/76  She is concerned that flonase and zyrtec is causing her BP to be higher than usual  She feels that her breathing has improved to baseline and she is not coughing  She wants to stop using these meds  Advised will see what Dr Kendrick Fries thinks  If BP remains elevated or has new symptoms seek emergent care Please advise, thanks

## 2022-01-28 NOTE — Telephone Encounter (Signed)
Elizabeth Leash, MD  to Me     01/27/22  6:28 PM  OK to stop both for now and see what happens with symptoms and BP    Spoke with the pt  She states she already stopped flonase and zyrtec and her BP has come down to normal  She is asking what she can take for cough and rhinitis in the future to avoid elevated BP again  Please advise thanks

## 2022-01-28 NOTE — Telephone Encounter (Signed)
Lupita Leash, MD  to Me     01/28/22 11:31 AM Saline sprays prn Delsym OTC prn   Spoke with the pt and notified of response per Dr Kendrick Fries. She verbalized understanding. Nothing further needed.

## 2022-01-30 ENCOUNTER — Ambulatory Visit (HOSPITAL_COMMUNITY)
Admission: RE | Admit: 2022-01-30 | Discharge: 2022-01-30 | Disposition: A | Discharge status: Home / Self Care | Payer: Medicare Other | Source: Ambulatory Visit | Attending: Gastroenterology | Admitting: Gastroenterology

## 2022-01-30 DIAGNOSIS — R932 Abnormal findings on diagnostic imaging of liver and biliary tract: Secondary | ICD-10-CM | POA: Diagnosis present

## 2022-02-02 ENCOUNTER — Encounter (HOSPITAL_COMMUNITY): Payer: Self-pay

## 2022-02-02 ENCOUNTER — Other Ambulatory Visit: Payer: Self-pay

## 2022-02-02 ENCOUNTER — Emergency Department (HOSPITAL_COMMUNITY): Payer: Medicare Other

## 2022-02-02 ENCOUNTER — Emergency Department (HOSPITAL_COMMUNITY)
Admission: EM | Admit: 2022-02-02 | Discharge: 2022-02-02 | Disposition: A | Discharge status: Home / Self Care | Payer: Medicare Other | Attending: Emergency Medicine | Admitting: Emergency Medicine

## 2022-02-02 DIAGNOSIS — Z79899 Other long term (current) drug therapy: Secondary | ICD-10-CM | POA: Diagnosis not present

## 2022-02-02 DIAGNOSIS — Z7901 Long term (current) use of anticoagulants: Secondary | ICD-10-CM | POA: Insufficient documentation

## 2022-02-02 DIAGNOSIS — E871 Hypo-osmolality and hyponatremia: Secondary | ICD-10-CM | POA: Diagnosis not present

## 2022-02-02 DIAGNOSIS — I4891 Unspecified atrial fibrillation: Secondary | ICD-10-CM | POA: Diagnosis not present

## 2022-02-02 DIAGNOSIS — I10 Essential (primary) hypertension: Secondary | ICD-10-CM | POA: Diagnosis not present

## 2022-02-02 DIAGNOSIS — R42 Dizziness and giddiness: Secondary | ICD-10-CM

## 2022-02-02 LAB — URINALYSIS, ROUTINE W REFLEX MICROSCOPIC
Bilirubin Urine: NEGATIVE
Glucose, UA: NEGATIVE mg/dL
Ketones, ur: NEGATIVE mg/dL
Leukocytes,Ua: NEGATIVE
Nitrite: NEGATIVE
Protein, ur: NEGATIVE mg/dL
Specific Gravity, Urine: 1.005 (ref 1.005–1.030)
pH: 7 (ref 5.0–8.0)

## 2022-02-02 LAB — CBC WITH DIFFERENTIAL/PLATELET
Abs Immature Granulocytes: 0.02 10*3/uL (ref 0.00–0.07)
Basophils Absolute: 0 10*3/uL (ref 0.0–0.1)
Basophils Relative: 0 %
Eosinophils Absolute: 0.1 10*3/uL (ref 0.0–0.5)
Eosinophils Relative: 1 %
HCT: 38.4 % (ref 36.0–46.0)
Hemoglobin: 12.3 g/dL (ref 12.0–15.0)
Immature Granulocytes: 0 %
Lymphocytes Relative: 12 %
Lymphs Abs: 1.1 10*3/uL (ref 0.7–4.0)
MCH: 27.8 pg (ref 26.0–34.0)
MCHC: 32 g/dL (ref 30.0–36.0)
MCV: 86.9 fL (ref 80.0–100.0)
Monocytes Absolute: 0.9 10*3/uL (ref 0.1–1.0)
Monocytes Relative: 9 %
Neutro Abs: 7.1 10*3/uL (ref 1.7–7.7)
Neutrophils Relative %: 78 %
Platelets: 325 10*3/uL (ref 150–400)
RBC: 4.42 MIL/uL (ref 3.87–5.11)
RDW: 14.6 % (ref 11.5–15.5)
WBC: 9.2 10*3/uL (ref 4.0–10.5)
nRBC: 0 % (ref 0.0–0.2)

## 2022-02-02 LAB — COMPREHENSIVE METABOLIC PANEL
ALT: 18 U/L (ref 0–44)
AST: 20 U/L (ref 15–41)
Albumin: 4.1 g/dL (ref 3.5–5.0)
Alkaline Phosphatase: 50 U/L (ref 38–126)
Anion gap: 6 (ref 5–15)
BUN: 18 mg/dL (ref 8–23)
CO2: 22 mmol/L (ref 22–32)
Calcium: 9.4 mg/dL (ref 8.9–10.3)
Chloride: 107 mmol/L (ref 98–111)
Creatinine, Ser: 0.73 mg/dL (ref 0.44–1.00)
GFR, Estimated: >60 mL/min (ref >60)
Glucose, Bld: 106 mg/dL — ABNORMAL HIGH (ref 70–99)
Potassium: 4 mmol/L (ref 3.5–5.1)
Sodium: 135 mmol/L (ref 135–145)
Total Bilirubin: 0.7 mg/dL (ref 0.3–1.2)
Total Protein: 7.5 g/dL (ref 6.5–8.1)

## 2022-02-02 LAB — TROPONIN I (HIGH SENSITIVITY)
Troponin I (High Sensitivity): 7 ng/L (ref <18)
Troponin I (High Sensitivity): 8 ng/L (ref <18)

## 2022-02-02 MED ORDER — HYDRALAZINE HCL 25 MG PO TABS: 25.0000 mg | ORAL_TABLET | Freq: Four times a day (QID) | ORAL | 0 refills | Status: AC | PRN

## 2022-02-02 MED ORDER — DIPHENHYDRAMINE HCL 50 MG/ML IJ SOLN
12.5000 mg | Freq: Once | INTRAMUSCULAR | Status: AC
  Administered 2022-02-02: 12.5 mg via INTRAVENOUS
  Filled 2022-02-02: qty 1

## 2022-02-02 MED ORDER — HYDRALAZINE HCL 20 MG/ML IJ SOLN
10.0000 mg | Freq: Once | INTRAMUSCULAR | Status: AC
  Administered 2022-02-02: 10 mg via INTRAVENOUS
  Filled 2022-02-02: qty 1

## 2022-02-02 MED ORDER — HYDRALAZINE HCL 20 MG/ML IJ SOLN: 10.0000 mg | Freq: Once | INTRAMUSCULAR | Status: DC

## 2022-02-02 MED ORDER — METOCLOPRAMIDE HCL 5 MG/ML IJ SOLN
10.0000 mg | Freq: Once | INTRAMUSCULAR | Status: AC
  Administered 2022-02-02: 10 mg via INTRAVENOUS
  Filled 2022-02-02: qty 2

## 2022-02-02 NOTE — ED Provider Notes (Signed)
[facility] Provider Note   CSN: [id number] Arrival date & time: 02/02/22  1015     History  Chief Complaint  Patient presents with   Hypertension   abnormal labs    Elizabeth Barnett is a 81 y.o. female hx of hypertension, A-fib on Eliquis, who presented with chest pain and dizziness and hypotension.  Patient was recently admitted for similar symptoms.  She had an MRI brain that was unremarkable.  Patient was observed in hospital for about 24 hours and her blood pressure came down.  Patient has tried multiple BP meds.  She is currently on Cozaar 100 mg daily.  She states that she had hyponatremia from hydrochlorothiazide and she did not tolerate Norvasc due to swelling.  Patient also has a baseline heart rate in the 60s to 70s so could not give beta-blockers.  Patient states that she feels dizzy today.  She also has some headaches..  Also had some intermittent left-sided chest pain.  She went to urgent care and her blood pressure was over 200.  She states that she was told that she should take hydralazine when blood pressure is over 180 but she has not taken any at  The history is provided by the patient.       Home Medications Prior to Admission medications   Medication Sig Start Date End Date Taking? Authorizing Provider  atorvastatin (LIPITOR) 20 MG tablet Take 20 mg by mouth at bedtime.    [provider]  benzonatate (TESSALON) 200 MG capsule Take 1 capsule (200 mg total) by mouth 3 (three) times daily as needed for cough. 01/17/22   Parrett, Virgel Bouquet, NP  calcium citrate (CALCITRATE - DOSED IN MG ELEMENTAL CALCIUM) 950 (200 Ca) MG tablet Take 200 mg of elemental calcium by mouth 2 (two) times daily.    [provider]  cephALEXin (KEFLEX) 500 MG capsule Take 1 capsule (500 mg total) by mouth every 12 (twelve) hours. 01/22/22   Jonah Blue, MD  cetirizine (ZYRTEC) 10 MG tablet Take 10 mg by mouth daily.    [provider]  Cholecalciferol (VITAMIN D3) 25 MCG (1000 UT) CAPS Take 1,000 Units by mouth in the morning.    [provider]  denosumab (PROLIA) 60 MG/ML SOSY injection Inject 60 mg into the skin every 6 (six) months. 03/31/13   [provider]  ELIQUIS 2.5 MG TABS tablet Take 2.5 mg by mouth in the morning and at bedtime.    [provider]  famotidine (PEPCID) 20 MG tablet Take 20 mg by mouth 2 (two) times daily.    [provider]  flecainide (TAMBOCOR) 50 MG tablet Take 50 mg by mouth in the morning and at bedtime. 07/31/21   [provider]  fluticasone (FLONASE) 50 MCG/ACT nasal spray Place 2 sprays into both nostrils daily. 01/14/22   Lupita Leash, MD  hydrALAZINE (APRESOLINE) 25 MG tablet Take 1 tablet (25 mg total) by mouth every 6 (six) hours as needed (SBP >180). 01/22/22   Jonah Blue, MD  losartan (COZAAR) 100 MG tablet Take 100 mg by mouth daily.    [provider]  MAGNESIUM GLUCONATE PO Take 1 tablet by mouth 3 (three) times daily with meals.    [provider]  NON FORMULARY Place 1 Application into both eyes See admin instructions. Apply a very warm washcloth to both eyes every morning and afternoon    [provider]  polyethylene glycol (MIRALAX / GLYCOLAX)  packet Take 17 g by mouth in the morning and at bedtime.    [provider]  RESTASIS 0.05 % ophthalmic emulsion Place 1 drop into both eyes in the morning and at bedtime.    [provider]  SYSTANE ULTRA PF 0.4-0.3 % SOLN Place 1 drop into both eyes See admin instructions. Place 1 drop into both eyes in the morning and at bedtime- may also use throughout the day as needed/as directed for dryness    [provider]  vitamin B-12 (CYANOCOBALAMIN) 1000 MCG tablet Take 1,000 mcg by mouth daily.    [provider]  Wheat Dextrin (BENEFIBER PO) Take by mouth See admin instructions. Mix 2 tablespoonsful of powder into 4-6 ounces  of water and drink every morning and at bedtime    [provider]      Allergies    Bee venom, Diclofenac sodium, Doxycycline hyclate, Iodinated contrast media, Meloxicam, Molnupiravir, Nsaids, Voltaren [diclofenac sodium], Acyclovir, Denosumab, Lorazepam, Neomycin-polymyxin-dexameth, Zoledronic acid, Amlodipine, Buspar [buspirone], Lexapro [escitalopram], and Promethazine    Review of Systems   Review of Systems  Cardiovascular:  Positive for chest pain.  Neurological:  Positive for dizziness and headaches.  All other systems reviewed and are negative.   Physical Exam Updated Vital Signs BP (!) 206/67 (BP Location: Right Arm)   Pulse 71   Temp (!) 97.5 F (36.4 C) (Oral)   Resp 18   Ht 4\' 11"  (1.499 m)   Wt 56.2 kg   SpO2 100%   BMI 25.04 kg/m  Physical Exam Vitals and nursing note reviewed.  Constitutional:      Appearance: Normal appearance.  HENT:     Head: Normocephalic.     Nose: Nose normal.     Mouth/Throat:     Mouth: Mucous membranes are moist.  Eyes:     Extraocular Movements: Extraocular movements intact.     Pupils: Pupils are equal, round, and reactive to light.  Cardiovascular:     Rate and Rhythm: Normal rate and regular rhythm.     Pulses: Normal pulses.     Heart sounds: Normal heart sounds.  Pulmonary:     Effort: Pulmonary effort is normal.     Breath sounds: Normal breath sounds.  Abdominal:     General: Abdomen is flat.     Palpations: Abdomen is soft.  Musculoskeletal:        General: Normal range of motion.     Cervical back: Normal range of motion and neck supple.  Skin:    General: Skin is warm.     Capillary Refill: Capillary refill takes less than 2 seconds.  Neurological:     General: No focal deficit present.     Mental Status: She is alert and oriented to person, place, and time.     Comments: Cranial nerves II to XII intact.  Patient has normal strength and sensation bilateral arms and legs.  Psychiatric:        Mood  and Affect: Mood normal.        Behavior: Behavior normal.     ED Results / Procedures / Treatments   Labs (all labs ordered are listed, but only abnormal results are displayed) Labs Reviewed  COMPREHENSIVE METABOLIC PANEL - Abnormal; Notable for the following components:      Result Value   Glucose, Bld 106 (*)    All other components within normal limits  URINALYSIS, ROUTINE W REFLEX MICROSCOPIC - Abnormal; Notable for the following components:  Color, Urine STRAW (*)    Hgb urine dipstick SMALL (*)    Bacteria, UA RARE (*)    All other components within normal limits  CBC WITH DIFFERENTIAL/PLATELET  TROPONIN I (HIGH SENSITIVITY)  TROPONIN I (HIGH SENSITIVITY)    EKG EKG Interpretation  Date/Time:  Sunday February 02 2022 10:45:35 EST Ventricular Rate:  63 PR Interval:  206 QRS Duration: 168 QT Interval:  457 QTC Calculation: 468 R Axis:   -72 Text Interpretation: Sinus rhythm Nonspecific IVCD with LAD Left ventricular hypertrophy No significant change since last tracing Confirmed by Richardean Canal [phone]) on 02/02/2022 3:30:39 PM  Radiology CT Head Wo Contrast  Result Date: 02/02/2022 CLINICAL DATA:  81 year old female with dizziness, syncope. EXAM: CT HEAD WITHOUT CONTRAST TECHNIQUE: Contiguous axial images were obtained from the base of the skull through the vertex without intravenous contrast. RADIATION DOSE REDUCTION: This exam was performed according to the departmental dose-optimization program which includes automated exposure control, adjustment of the mA and/or kV according to patient size and/or use of iterative reconstruction technique. COMPARISON:  Brain MRI 01/22/2022.  Head CT 01/21/2022. FINDINGS: Brain: No midline shift, ventriculomegaly, mass effect, evidence of mass lesion, intracranial hemorrhage or evidence of cortically based acute infarction. Stable mild for age cerebral white matter hypodensity primarily limited to the right centrum semiovale. Vascular:  Calcified atherosclerosis at the skull base. No suspicious intracranial vascular hyperdensity. Skull: No acute osseous abnormality identified. Sinuses/Orbits: Bilateral layering low-density fluid now in the maxillary sinuses. But only mild paranasal sinus mucosal thickening elsewhere. Tympanic cavities and mastoids remain clear. Other: No acute orbit or scalp soft tissue finding. IMPRESSION: 1. Stable non contrast CT appearance of the brain. Mild for age nonspecific white matter disease as described by MRI this month. 2. Layering fluid in the maxillary sinuses, consider acute sinusitis. Electronically Signed   By: Odessa Fleming M.D.   On: 02/02/2022 10:58    Procedures Procedures    Medications Ordered in ED Medications  hydrALAZINE (APRESOLINE) injection 10 mg (has no administration in time range)  metoCLOPramide (REGLAN) injection 10 mg (has no administration in time range)  diphenhydrAMINE (BENADRYL) injection 12.5 mg (has no administration in time range)    ED Course/ Medical Decision Making/ A&P                           Medical Decision Making ALAYJA ARMAS is a 81 y.o. female here presenting with headaches and chest pain.  I reviewed her records from recent admission.  Patient had extensive workup including MRI brain.  Patient was thought to have symptomatic hypertension.  Patient had tried multiple BP meds and did not tolerate them. Will get CBC and CMP and troponin x 2 and CT head.  Will give BP meds of migraine cocktail and reassess  5:31 PM Labs showed normal troponin.  CT head showed no bleed.  Chest x-ray is clear.  Blood pressure is now down to 140s.  Patient was prescribed hydralazine 25 mg every 6 hours if BP is greater than 180.  I think this is a good dose for her.  I told her to keep a good log for her blood pressure.  She is still feeling dizzy.  She just had an MRI I do not think she has a posterior circulation stroke.  I think she has symptomatic hypertension.  Told her that she  needs to follow-up with her primary care doctor next week  Problems Addressed: Dizziness:  acute illness or injury Hypertension, unspecified type: chronic illness or injury with exacerbation, progression, or side effects of treatment  Amount and/or Complexity of Data Reviewed Labs: ordered. Decision-making details documented in ED Course. Radiology: ordered and independent interpretation performed. Decision-making details documented in ED Course.  Risk Prescription drug management.    Final Clinical Impression(s) / ED Diagnoses Final diagnoses:  None    Rx / DC Orders ED Discharge Orders     None         Charlynne Pander, MD 02/02/22 1736

## 2022-02-02 NOTE — ED Triage Notes (Signed)
Patient c/o hypertension, dizziness, and a slight headache today. Patient states she went to an UC this AM and her systolic BP was >200. Patient reports that she ha shad a Na++ level-131. Patient also states that when she started Hydralazine she had 4 days of diarrhea.  Patient denies any blurred vision, numbness , or tingling.

## 2022-02-02 NOTE — ED Provider Triage Note (Signed)
Emergency Medicine Provider Triage Evaluation Note  SADIE HAZELETT , a 81 y.o. female  was evaluated in triage.  Pt complains of elevated BP of 200+ at UC. Has been dizzy and felt unwell since 2AM. Has not been taking hydralazine d/t nausea--prescribe by inpt [place].  Review of Systems  Positive: dizziness Negative: Chest pain, SOB  Physical Exam  BP (!) 177/98 (BP Location: Right Arm)   Pulse 75   Temp 97.7 F (36.5 C) (Oral)   Resp 18   SpO2 100%  Gen:   Awake, no distress   Resp:  Normal effort  MSK:   Moves extremities without difficulty  Other:    Medical Decision Making  Medically screening exam initiated at 10:33 AM.  Appropriate orders placed.  VONA WHITERS was informed that the remainder of the evaluation will be completed by another provider, this initial triage assessment does not replace that evaluation, and the importance of remaining in the ED until their evaluation is complete.     Pete Pelt, [place] 02/02/22 1054

## 2022-02-02 NOTE — Discharge Instructions (Signed)
Your blood pressure was elevated.  I recommend that you take Cozaar as prescribed.  I also recommend that you take hydralazine 25 mg every 6 hours if your blood pressures greater than 180.  You need to keep a detailed log of your blood pressures at home and please call your doctor next week for appointment  Return to ER if you have worse dizziness, headache, passing out, chest pain

## 2022-02-20 ENCOUNTER — Encounter: Payer: Self-pay | Admitting: Pulmonary Disease

## 2022-02-20 ENCOUNTER — Ambulatory Visit: Payer: Medicare Other | Admitting: Pulmonary Disease

## 2022-02-20 ENCOUNTER — Ambulatory Visit (INDEPENDENT_AMBULATORY_CARE_PROVIDER_SITE_OTHER): Payer: Medicare Other | Admitting: Pulmonary Disease

## 2022-02-20 VITALS — BP 164/84 | HR 73 | Temp 98.4°F | Ht 59.0 in | Wt 128.8 lb

## 2022-02-20 DIAGNOSIS — R053 Chronic cough: Secondary | ICD-10-CM | POA: Diagnosis not present

## 2022-02-20 DIAGNOSIS — K219 Gastro-esophageal reflux disease without esophagitis: Secondary | ICD-10-CM

## 2022-02-20 DIAGNOSIS — J301 Allergic rhinitis due to pollen: Secondary | ICD-10-CM | POA: Diagnosis not present

## 2022-02-20 NOTE — Progress Notes (Signed)
Synopsis: Referred in November 2023 for for chronic cough by Dr. Paulita Fujita.  She had COVID in June 2023 and had a hsotpialziation for the same.  Subjective:   PATIENT ID: Elizabeth Elizabeth Barnett GENDER: female DOB: 1940-12-29, MRN: [id number]   HPI  Chief Complaint  Patient presents with   Follow-up    Pt states that she had a lot of congestion and wheezing x2 days.   Elizabeth Elizabeth Barnett says she hasn't been coughing much, though a couple of nights ago she had some post nasal drip.  She wheezed some a couple nights ago too.  Some headache.  She's been hospitalized a few times.  She feels a little anxious right now.  The cough improved.  She took the pepcid.  She is using her nasal steroid  Past Medical History:  Diagnosis Date   Chronic cough    Constipation    Hypertension    Pacemaker    PAF (paroxysmal atrial fibrillation) ([place])    on Eliquis         Review of Systems  Constitutional:  Negative for chills, fever, malaise/fatigue and weight loss.  HENT:  Positive for congestion. Negative for sinus pain and sore throat.   Respiratory:  Negative for cough, sputum production and shortness of breath.   Cardiovascular:  Negative for chest pain and leg swelling.      Objective:  Physical Exam   Vitals:   02/20/22 0845  BP: (!) 164/84  Pulse: 73  Temp: 98.4 F (36.9 C)  TempSrc: Oral  SpO2: 98%  Weight: 128 lb 12.8 oz (58.4 kg)  Height: 4\' 11"  (1.499 m)    Repeat blood pressure 167/84  Gen: well appearing HENT: OP clear, neck supple PULM: CTA B, normal effort  CV: RRR, no mgr GI: BS+, soft, nontender Derm: no cyanosis or rash Psyche: normal Elizabeth Barnett and affect    CBC    Component Value Date/Time   WBC 9.2 02/02/2022 1127   RBC 4.42 02/02/2022 1127   HGB 12.3 02/02/2022 1127   HCT 38.4 02/02/2022 1127   PLT 325 02/02/2022 1127   MCV 86.9 02/02/2022 1127   MCH 27.8 02/02/2022 1127   MCHC 32.0 02/02/2022 1127   RDW 14.6 02/02/2022 1127   LYMPHSABS 1.1 02/02/2022 1127    MONOABS 0.9 02/02/2022 1127   EOSABS 0.1 02/02/2022 1127   BASOSABS 0.0 02/02/2022 1127     Chest imaging: June 2023 portable AP chest x-ray images independently reviewed showing a dual-lead pacemaker, boot-shaped heart, normal pulmonary parenchyma 01/2022 HRCT > no evidence of ILD, cirrhotic configuration of liver, images independently reviewed, no pulmonary parenchymal abnormality identified  PFT: 12/2021 FENO 26 ppb  Labs:  Path:  Echo:  Heart Catheterization:  Cardiac stress test: 11/2021 normal     Assessment & Plan:   Chronic cough  Non-seasonal allergic rhinitis due to pollen  Gastroesophageal reflux disease, unspecified whether esophagitis present  Discussion: Elizabeth Elizabeth Barnett has been struggling with her blood pressure, but fortunately she does not have a lung problem and her chronic cough has resolved with treatment of gastroesophageal reflux disease and allergic rhinitis.  CT scanning of the chest showed no evidence of a lung disease, lungs are clear on exam today.  Plan: Chronic cough: Resolved  Allergic rhinitis: Keep taking Flonase as you are doing  Gastroesophageal reflux disease: Take Pepcid twice a day in the event of a cough  High blood pressure: Follow-up with your cardiologist regarding this  [facility] in [facility] has great  physicians including Dr. Willette Alma  Follow-up with [place] on an as-needed basis  Immunizations: Immunization History  Administered Date(s) Administered   Influenza, High Dose Seasonal PF 11/12/2020, 11/12/2021   Rabies, IM 10/18/2013, 10/21/2013, 10/25/2013   Tdap 10/18/2013, 03/16/2021     Current Outpatient Medications:    atorvastatin (LIPITOR) 20 MG tablet, Take 20 mg by mouth at bedtime., Disp: , Rfl:    calcium citrate (CALCITRATE - DOSED IN MG ELEMENTAL CALCIUM) 950 (200 Ca) MG tablet, Take 200 mg of elemental calcium by mouth 2 (two) times daily., Disp: , Rfl:    Cholecalciferol (VITAMIN D3) 25 MCG (1000  UT) CAPS, Take 1,000 Units by mouth in the morning., Disp: , Rfl:    denosumab (PROLIA) 60 MG/ML SOSY injection, Inject 60 mg into the skin every 6 (six) months., Disp: , Rfl:    ELIQUIS 2.5 MG TABS tablet, Take 2.5 mg by mouth in the morning and at bedtime., Disp: , Rfl:    famotidine (PEPCID) 20 MG tablet, Take 20 mg by mouth 2 (two) times daily., Disp: , Rfl:    flecainide (TAMBOCOR) 50 MG tablet, Take 50 mg by mouth in the morning and at bedtime., Disp: , Rfl:    hydrALAZINE (APRESOLINE) 25 MG tablet, Take 1 tablet (25 mg total) by mouth every 6 (six) hours as needed (SBP >180)., Disp: 30 tablet, Rfl: 0   hydrOXYzine (ATARAX) 10 MG tablet, Take 10 mg by mouth 3 (three) times daily., Disp: , Rfl:    losartan (COZAAR) 100 MG tablet, Take 100 mg by mouth daily., Disp: , Rfl:    MAGNESIUM GLUCONATE PO, Take 1 tablet by mouth 3 (three) times daily with meals., Disp: , Rfl:    NON FORMULARY, Place 1 Application into both eyes See admin instructions. Apply a very warm washcloth to both eyes every morning and afternoon, Disp: , Rfl:    polyethylene glycol (MIRALAX / GLYCOLAX) packet, Take 17 g by mouth in the morning and at bedtime., Disp: , Rfl:    RESTASIS 0.05 % ophthalmic emulsion, Place 1 drop into both eyes in the morning and at bedtime., Disp: , Rfl:    SYSTANE ULTRA PF 0.4-0.3 % SOLN, Place 1 drop into both eyes See admin instructions. Place 1 drop into both eyes in the morning and at bedtime- may also use throughout the day as needed/as directed for dryness, Disp: , Rfl:    vitamin B-12 (CYANOCOBALAMIN) 1000 MCG tablet, Take 1,000 mcg by mouth daily., Disp: , Rfl:    Wheat Dextrin (BENEFIBER PO), Take by mouth See admin instructions. Mix 2 tablespoonsful of powder into 4-6 ounces of water and drink every morning and at bedtime, Disp: , Rfl:

## 2022-02-20 NOTE — Patient Instructions (Signed)
Chronic cough: Resolved  Allergic rhinitis: Keep taking Flonase as you are doing  Gastroesophageal reflux disease: Take Pepcid twice a day in the event of a cough  High blood pressure: Follow-up with your cardiologist regarding this  [facility] in [facility] has great physicians including Dr. Willette Alma  Follow-up with [place] on an as-needed basis

## 2022-06-20 ENCOUNTER — Ambulatory Visit
Admission: RE | Admit: 2022-06-20 | Discharge: 2022-06-20 | Disposition: A | Discharge status: Home / Self Care | Payer: Medicare Other | Source: Ambulatory Visit | Attending: Gastroenterology | Admitting: Gastroenterology

## 2022-06-20 ENCOUNTER — Other Ambulatory Visit: Payer: Self-pay | Admitting: Gastroenterology

## 2022-06-20 DIAGNOSIS — Z8719 Personal history of other diseases of the digestive system: Secondary | ICD-10-CM

## 2022-06-20 DIAGNOSIS — R197 Diarrhea, unspecified: Secondary | ICD-10-CM

## 2022-07-15 ENCOUNTER — Other Ambulatory Visit (HOSPITAL_COMMUNITY): Payer: Self-pay | Admitting: Orthopaedic Surgery

## 2022-07-15 DIAGNOSIS — M545 Low back pain, unspecified: Secondary | ICD-10-CM

## 2022-08-20 ENCOUNTER — Other Ambulatory Visit: Payer: Self-pay

## 2022-08-20 DIAGNOSIS — M810 Age-related osteoporosis without current pathological fracture: Secondary | ICD-10-CM | POA: Insufficient documentation

## 2022-08-22 ENCOUNTER — Telehealth: Payer: Self-pay | Admitting: Pharmacy Technician

## 2022-08-22 NOTE — Telephone Encounter (Signed)
Auth Submission: NO AUTH NEEDED Site of care: Site of care: [facility] Payer: MEDICARE A/B & AARP Medication & CPT/J Code(s) submitted: Prolia (Denosumab) [phone] Route of submission (phone, fax, portal):  Phone # Fax # Auth type: Buy/Bill Units/visits requested: 1 Reference number:  Approval from: 08/22/22 to 02/17/23    Medicare will cover 80% and AARP will pick-up remaining 20%.

## 2022-09-10 ENCOUNTER — Other Ambulatory Visit: Payer: Self-pay | Admitting: Gastroenterology

## 2022-09-10 DIAGNOSIS — R1310 Dysphagia, unspecified: Secondary | ICD-10-CM

## 2022-09-11 ENCOUNTER — Ambulatory Visit
Admission: RE | Admit: 2022-09-11 | Discharge: 2022-09-11 | Disposition: A | Discharge status: Home / Self Care | Payer: Medicare Other | Source: Ambulatory Visit | Attending: Gastroenterology | Admitting: Gastroenterology

## 2022-09-11 DIAGNOSIS — R1310 Dysphagia, unspecified: Secondary | ICD-10-CM

## 2022-09-15 ENCOUNTER — Ambulatory Visit (INDEPENDENT_AMBULATORY_CARE_PROVIDER_SITE_OTHER): Payer: Medicare Other

## 2022-09-15 ENCOUNTER — Ambulatory Visit: Payer: Medicare Other

## 2022-09-15 VITALS — BP 130/78 | HR 78 | Temp 97.5°F | Resp 20 | Ht 59.0 in | Wt 129.8 lb

## 2022-09-15 DIAGNOSIS — M810 Age-related osteoporosis without current pathological fracture: Secondary | ICD-10-CM

## 2022-09-15 MED ORDER — DENOSUMAB 60 MG/ML ~~LOC~~ SOSY
60.0000 mg | PREFILLED_SYRINGE | Freq: Once | SUBCUTANEOUS | Status: AC
  Administered 2022-09-15: 60 mg via SUBCUTANEOUS
  Filled 2022-09-15: qty 1

## 2022-09-15 NOTE — Progress Notes (Signed)
Diagnosis: Osteoporosis  Provider:  Chilton Greathouse MD  Procedure: Injection  Prolia (Denosumab), Dose: 60 mg, Site: subcutaneous, Number of injections: 1  Post Care: Patient declined observation  Discharge: Condition: Good, Destination: Home . AVS Declined  Performed by:  Loney Hering, LPN

## 2022-10-01 ENCOUNTER — Ambulatory Visit (HOSPITAL_COMMUNITY)
Admission: RE | Admit: 2022-10-01 | Discharge: 2022-10-01 | Disposition: A | Discharge status: Home / Self Care | Payer: Medicare Other | Source: Ambulatory Visit | Attending: Orthopaedic Surgery | Admitting: Orthopaedic Surgery

## 2022-10-01 DIAGNOSIS — M545 Low back pain, unspecified: Secondary | ICD-10-CM | POA: Diagnosis present

## 2022-11-25 ENCOUNTER — Other Ambulatory Visit (HOSPITAL_COMMUNITY): Payer: Self-pay | Admitting: Physical Medicine and Rehabilitation

## 2022-11-25 ENCOUNTER — Encounter: Payer: Self-pay | Admitting: Physical Medicine and Rehabilitation

## 2022-11-25 DIAGNOSIS — M545 Low back pain, unspecified: Secondary | ICD-10-CM

## 2022-11-25 DIAGNOSIS — M549 Dorsalgia, unspecified: Secondary | ICD-10-CM

## 2023-01-20 ENCOUNTER — Ambulatory Visit (HOSPITAL_COMMUNITY)
Admission: RE | Admit: 2023-01-20 | Discharge: 2023-01-20 | Disposition: A | Discharge status: Home / Self Care | Payer: Medicare Other | Source: Ambulatory Visit | Attending: Physical Medicine and Rehabilitation | Admitting: Physical Medicine and Rehabilitation

## 2023-01-20 DIAGNOSIS — M549 Dorsalgia, unspecified: Secondary | ICD-10-CM

## 2023-01-20 DIAGNOSIS — M545 Low back pain, unspecified: Secondary | ICD-10-CM | POA: Diagnosis present

## 2023-02-26 ENCOUNTER — Ambulatory Visit (HOSPITAL_COMMUNITY): Payer: Medicare Other

## 2023-03-02 ENCOUNTER — Encounter: Payer: Self-pay | Admitting: Sports Medicine

## 2023-03-02 ENCOUNTER — Telehealth: Payer: Self-pay

## 2023-03-02 NOTE — Telephone Encounter (Signed)
 Auth Submission: NO AUTH NEEDED Site of care: Site of care: [facility] Payer: Medicare A/B with AARP supplement Medication & CPT/J Code(s) submitted: Prolia  (Denosumab ) [id number] Route of submission (phone, fax, portal):  Phone # Fax # Auth type: Buy/Bill PB Units/visits requested: 60mg  x 2 doses Reference number:  Approval from: 03/02/23 to 03/19/24

## 2023-03-18 ENCOUNTER — Ambulatory Visit: Payer: Medicare Other

## 2023-03-19 ENCOUNTER — Ambulatory Visit: Payer: Medicare Other | Admitting: *Deleted

## 2023-03-19 VITALS — BP 126/74 | HR 80 | Temp 97.9°F | Resp 16 | Ht 59.0 in | Wt 130.8 lb

## 2023-03-19 DIAGNOSIS — M810 Age-related osteoporosis without current pathological fracture: Secondary | ICD-10-CM

## 2023-03-19 MED ORDER — DENOSUMAB 60 MG/ML ~~LOC~~ SOSY
60.0000 mg | PREFILLED_SYRINGE | Freq: Once | SUBCUTANEOUS | Status: AC
  Administered 2023-03-19: 60 mg via SUBCUTANEOUS
  Filled 2023-03-19: qty 1

## 2023-03-19 NOTE — Progress Notes (Signed)
Diagnosis: Osteoporosis  Provider:  Chilton Greathouse MD  Procedure: Injection  Prolia (Denosumab), Dose: 60 mg, Site: subcutaneous, Number of injections: 1  Injection Site(s): Right arm  Post Care: Observation period completed  Discharge: Condition: Good, Destination: Home . AVS Provided  Performed by:  Forrest Moron, RN

## 2023-04-30 ENCOUNTER — Other Ambulatory Visit: Payer: Self-pay | Admitting: Gastroenterology

## 2023-04-30 ENCOUNTER — Telehealth: Payer: Self-pay

## 2023-04-30 DIAGNOSIS — R109 Unspecified abdominal pain: Secondary | ICD-10-CM

## 2023-05-01 ENCOUNTER — Telehealth: Payer: Self-pay

## 2023-05-01 MED ORDER — DIPHENHYDRAMINE HCL 50 MG PO TABS: ORAL_TABLET | ORAL | 0 refills | Status: AC

## 2023-05-01 MED ORDER — PREDNISONE 50 MG PO TABS: ORAL_TABLET | ORAL | 0 refills | Status: AC

## 2023-05-04 ENCOUNTER — Ambulatory Visit
Admission: RE | Admit: 2023-05-04 | Discharge: 2023-05-04 | Disposition: A | Discharge status: Home / Self Care | Source: Ambulatory Visit | Attending: Gastroenterology | Admitting: Gastroenterology

## 2023-05-04 DIAGNOSIS — R109 Unspecified abdominal pain: Secondary | ICD-10-CM

## 2023-05-04 MED ORDER — IOPAMIDOL (ISOVUE-300) INJECTION 61%
100.0000 mL | Freq: Once | INTRAVENOUS | Status: AC | PRN
  Administered 2023-05-04: 100 mL via INTRAVENOUS

## 2023-06-29 ENCOUNTER — Ambulatory Visit: Payer: Self-pay | Admitting: Surgery

## 2023-07-22 NOTE — Patient Instructions (Signed)
 SURGICAL WAITING ROOM VISITATION  Patients having surgery or a procedure may have no more than 2 support people in the waiting area - these visitors may rotate.    Children under the age of 38 must have an adult with them who is not the patient.  Visitors with respiratory illnesses are discouraged from visiting and should remain at home.  If the patient needs to stay at the hospital during part of their recovery, the visitor guidelines for inpatient rooms apply. Pre-op nurse will coordinate an appropriate time for 1 support person to accompany patient in pre-op.  This support person may not rotate.    Please refer to the [facility] website for the visitor guidelines for Inpatients (after your surgery is over and you are in a regular room).    Your procedure is scheduled on: 07/30/23   Report to [facility] Main Entrance    Report to admitting at 8:15 AM   Call this number if you have problems the morning of surgery [phone]   Do not eat food :After Midnight.   After Midnight you may have the following liquids until 7:30 AM DAY OF SURGERY  Water Non-Citrus Juices (without pulp, NO RED-Apple, White grape, White cranberry) Black Coffee (NO MILK/CREAM OR CREAMERS, sugar ok)  Clear Tea (NO MILK/CREAM OR CREAMERS, sugar ok) regular and decaf                             Plain Jell-O (NO RED)                                           Fruit ices (not with fruit pulp, NO RED)                                     Popsicles (NO RED)                                                               Sports drinks like Gatorade (NO RED)                      If you have questions, please contact your surgeon's office.   FOLLOW BOWEL PREP AND ANY ADDITIONAL PRE OP INSTRUCTIONS YOU RECEIVED FROM YOUR SURGEON'S OFFICE!!!     Oral Hygiene is also important to reduce your risk of infection.                                    Remember - BRUSH YOUR TEETH THE MORNING OF SURGERY WITH YOUR  REGULAR TOOTHPASTE  DENTURES WILL BE REMOVED PRIOR TO SURGERY PLEASE DO NOT APPLY "Poly grip" OR ADHESIVES!!!   Stop all vitamins and herbal supplements 7 days before surgery.   Take these medicines the morning of surgery with A SIP OF WATER: Zyrtec, Flecainide               You may not have any metal on your body including hair pins,  jewelry, and body piercing             Do not wear make-up, lotions, powders, perfumes, or deodorant  Do not wear nail polish including gel and S&S, artificial/acrylic nails, or any other type of covering on natural nails including finger and toenails. If you have artificial nails, gel coating, etc. that needs to be removed by a nail salon please have this removed prior to surgery or surgery may need to be canceled/ delayed if the surgeon/ anesthesia feels like they are unable to be safely monitored.   Do not shave  48 hours prior to surgery.    Do not bring valuables to the hospital. [facility] IS NOT             RESPONSIBLE   FOR VALUABLES.   Contacts, glasses, dentures or bridgework may not be worn into surgery.  DO NOT BRING YOUR HOME MEDICATIONS TO THE HOSPITAL. PHARMACY WILL DISPENSE MEDICATIONS LISTED ON YOUR MEDICATION LIST TO YOU DURING YOUR ADMISSION IN THE HOSPITAL!    Patients discharged on the day of surgery will not be allowed to drive home.  Someone NEEDS to stay with you for the first 24 hours after anesthesia.   Special Instructions: Bring a copy of your healthcare power of attorney and living will documents the day of surgery if you haven't scanned them before.              Please read over the following fact sheets you were given: IF YOU HAVE QUESTIONS ABOUT YOUR PRE-OP INSTRUCTIONS PLEASE CALL [name]    If you received a COVID test during your pre-op visit  it is requested that you wear a mask when out in public, stay away from anyone that may not be feeling well and notify your surgeon if you develop symptoms. If you  test positive for Covid or have been in contact with anyone that has tested positive in the last 10 days please notify you surgeon.    [facility] - Preparing for Surgery Before surgery, you can play an important role.  Because skin is not sterile, your skin needs to be as free of germs as possible.  You can reduce the number of germs on your skin by washing with CHG (chlorahexidine gluconate) soap before surgery.  CHG is an antiseptic cleaner which kills germs and bonds with the skin to continue killing germs even after washing. Please DO NOT use if you have an allergy to CHG or antibacterial soaps.  If your skin becomes reddened/irritated stop using the CHG and inform your nurse when you arrive at Short Stay. Do not shave (including legs and underarms) for at least 48 hours prior to the first CHG shower.  You may shave your face/neck.  Please follow these instructions carefully:  1.  Shower with CHG Soap the night before surgery and the  morning of surgery.  2.  If you choose to wash your hair, wash your hair first as usual with your normal  shampoo.  3.  After you shampoo, rinse your hair and body thoroughly to remove the shampoo.                             4.  Use CHG as you would any other liquid soap.  You can apply chg directly to the skin and wash.  Gently with a scrungie or clean washcloth.  5.  Apply the CHG Soap to  your body ONLY FROM THE NECK DOWN.   Do   not use on face/ open                           Wound or open sores. Avoid contact with eyes, ears mouth and   genitals (private parts).                       Wash face,  Genitals (private parts) with your normal soap.             6.  Wash thoroughly, paying special attention to the area where your    surgery  will be performed.  7.  Thoroughly rinse your body with warm water from the neck down.  8.  DO NOT shower/wash with your normal soap after using and rinsing off the CHG Soap.                9.  Pat yourself dry with a clean  towel.            10.  Wear clean pajamas.            11.  Place clean sheets on your bed the night of your first shower and do not  sleep with pets. Day of Surgery : Do not apply any lotions/deodorants the morning of surgery.  Please wear clean clothes to the hospital/surgery center.  FAILURE TO FOLLOW THESE INSTRUCTIONS MAY RESULT IN THE CANCELLATION OF YOUR SURGERY  PATIENT SIGNATURE_________________________________  NURSE SIGNATURE__________________________________  ________________________________________________________________________

## 2023-07-22 NOTE — Progress Notes (Signed)
 COVID Vaccine Completed: yes  Date of COVID positive in last 90 days:  PCP - Darylene Epley Cardiologist - Emi Hanson, MD LOV 03/02/23  Chest x-ray - n/a EKG - 07/23/23 Epic/chart Stress Test - 12/10/21 [facility] ECHO - 09/03/18 [facility] Cardiac Cath - n/a Pacemaker/ICD device last checked: 05/13/23 in [facility], requested device orders via fax from [facility]  Spinal Cord Stimulator:n/a  Bowel Prep - no  Sleep Study - years ago per pt, negative CPAP -   Fasting Blood Sugar - pre DM, no meds or checks at home Checks Blood Sugar _____ times a day  Last dose of GLP1 agonist-  N/A GLP1 instructions:  Hold 7 days before surgery    Last dose of SGLT-2 inhibitors-  N/A SGLT-2 instructions:  Hold 3 days before surgery    Blood Thinner Instructions:  Eliquis , hold 2 days Aspirin Instructions: Last Dose: 07/27/23 1900  Activity level: Can go up a flight of stairs and perform activities of daily living without stopping and without symptoms of chest pain or shortness of breath. Has cane if needed.  Anesthesia review: PAF, RBBB, HTN, SSS, preDM  Patient denies shortness of breath, fever, cough and chest pain at PAT appointment  Patient verbalized understanding of instructions that were given to them at the PAT appointment. Patient was also instructed that they will need to review over the PAT instructions again at home before surgery.

## 2023-07-23 ENCOUNTER — Encounter (HOSPITAL_COMMUNITY)
Admission: RE | Admit: 2023-07-23 | Discharge: 2023-07-23 | Disposition: A | Discharge status: Home / Self Care | Source: Ambulatory Visit | Attending: Surgery | Admitting: Surgery

## 2023-07-23 ENCOUNTER — Other Ambulatory Visit: Payer: Self-pay

## 2023-07-23 ENCOUNTER — Encounter (HOSPITAL_COMMUNITY): Payer: Self-pay

## 2023-07-23 VITALS — BP 138/62 | HR 78 | Temp 97.9°F | Ht 59.0 in | Wt 130.0 lb

## 2023-07-23 DIAGNOSIS — Z01810 Encounter for preprocedural cardiovascular examination: Secondary | ICD-10-CM | POA: Insufficient documentation

## 2023-07-23 DIAGNOSIS — I10 Essential (primary) hypertension: Secondary | ICD-10-CM | POA: Diagnosis not present

## 2023-07-23 HISTORY — DX: Unspecified osteoarthritis, unspecified site: M19.90

## 2023-07-23 HISTORY — DX: Gastro-esophageal reflux disease without esophagitis: K21.9

## 2023-07-30 ENCOUNTER — Ambulatory Visit (HOSPITAL_COMMUNITY): Admission: RE | Admit: 2023-07-30 | Source: Ambulatory Visit | Admitting: Surgery

## 2023-07-30 ENCOUNTER — Encounter (HOSPITAL_COMMUNITY): Admission: RE | Payer: Self-pay | Source: Ambulatory Visit

## 2023-07-30 SURGERY — HEMORRHOIDECTOMY
Anesthesia: General

## 2023-09-08 ENCOUNTER — Other Ambulatory Visit: Payer: Self-pay | Admitting: Sports Medicine

## 2023-09-08 ENCOUNTER — Telehealth: Payer: Self-pay

## 2023-09-08 NOTE — Telephone Encounter (Signed)
 Auth Submission: NO AUTH NEEDED Site of care: Site of care: [facility] Payer: Medicare A/B with AARP supplement Medication & CPT/J Code(s) submitted: Prolia  (Denosumab ) [id number] Diagnosis Code:  Route of submission (phone, fax, portal):  Phone # Fax # Auth type: Buy/Bill PB Units/visits requested: 60mg  x 2 doses Reference number:  Approval from: 09/08/23 to 03/19/24

## 2023-09-22 ENCOUNTER — Ambulatory Visit: Payer: Medicare Other

## 2023-09-22 VITALS — BP 144/79 | HR 80 | Temp 97.6°F | Resp 20 | Ht 59.0 in | Wt 131.2 lb

## 2023-09-22 DIAGNOSIS — M810 Age-related osteoporosis without current pathological fracture: Secondary | ICD-10-CM

## 2023-09-22 MED ORDER — DENOSUMAB 60 MG/ML ~~LOC~~ SOSY
60.0000 mg | PREFILLED_SYRINGE | Freq: Once | SUBCUTANEOUS | Status: AC
Start: 2023-09-22 — End: 2023-09-22
  Administered 2023-09-22: 60 mg via SUBCUTANEOUS
  Filled 2023-09-22: qty 1

## 2023-09-22 NOTE — Progress Notes (Signed)
 Diagnosis: Osteoporosis  Provider:  Mannam, Praveen MD  Procedure: Injection  Prolia  (Denosumab ), Dose: 60 mg, Site: subcutaneous, Number of injections: 1  Injection Site(s): Right arm  Post Care: Patient declined observation  Discharge: Condition: Good, Destination: Home . AVS Declined  Performed by:  Lendel Quant, RN

## 2024-03-29 ENCOUNTER — Ambulatory Visit
